# Patient Record
Sex: Male | Born: 1940 | Race: White | Hispanic: No | Smoking: Former smoker
Health system: Southern US, Community
[De-identification: ages and names within clinical notes are randomized; demographics above are authoritative.]

## PROBLEM LIST (undated history)

## (undated) DIAGNOSIS — I2510 Atherosclerotic heart disease of native coronary artery without angina pectoris: Secondary | ICD-10-CM

## (undated) DIAGNOSIS — M1990 Unspecified osteoarthritis, unspecified site: Secondary | ICD-10-CM

## (undated) DIAGNOSIS — R42 Dizziness and giddiness: Secondary | ICD-10-CM

## (undated) HISTORY — PX: HERNIA REPAIR: SHX51

## (undated) HISTORY — PX: OTHER SURGICAL HISTORY: SHX169

## (undated) HISTORY — PX: TONSILLECTOMY: SUR1361

## (undated) SURGERY — PACEMAKER IMPLANT
Anesthesia: IV Sedation (MBSC Only)

---

## 2001-11-16 ENCOUNTER — Ambulatory Visit (HOSPITAL_COMMUNITY): Admission: RE | Admit: 2001-11-16 | Discharge: 2001-11-16 | Payer: Self-pay | Admitting: Pulmonary Disease

## 2001-12-10 ENCOUNTER — Ambulatory Visit (HOSPITAL_COMMUNITY): Admission: RE | Admit: 2001-12-10 | Discharge: 2001-12-10 | Payer: Self-pay | Admitting: Specialist

## 2001-12-10 ENCOUNTER — Encounter: Payer: Self-pay | Admitting: Specialist

## 2002-04-11 ENCOUNTER — Encounter (HOSPITAL_COMMUNITY): Admission: RE | Admit: 2002-04-11 | Discharge: 2002-05-11 | Payer: Self-pay | Admitting: *Deleted

## 2002-04-13 ENCOUNTER — Ambulatory Visit (HOSPITAL_COMMUNITY): Admission: RE | Admit: 2002-04-13 | Discharge: 2002-04-13 | Payer: Self-pay | Admitting: Pulmonary Disease

## 2002-05-11 ENCOUNTER — Encounter (HOSPITAL_COMMUNITY): Admission: RE | Admit: 2002-05-11 | Discharge: 2002-06-10 | Payer: Self-pay | Admitting: *Deleted

## 2002-06-10 ENCOUNTER — Encounter (HOSPITAL_COMMUNITY): Admission: RE | Admit: 2002-06-10 | Discharge: 2002-07-10 | Payer: Self-pay | Admitting: *Deleted

## 2002-07-11 ENCOUNTER — Encounter (HOSPITAL_COMMUNITY): Admission: RE | Admit: 2002-07-11 | Discharge: 2002-08-10 | Payer: Self-pay | Admitting: *Deleted

## 2003-05-11 ENCOUNTER — Ambulatory Visit (HOSPITAL_COMMUNITY)
Admission: RE | Admit: 2003-05-11 | Discharge: 2003-05-11 | Payer: Self-pay | Admitting: Physical Medicine and Rehabilitation

## 2003-05-11 ENCOUNTER — Encounter: Payer: Self-pay | Admitting: Physical Medicine and Rehabilitation

## 2004-06-05 ENCOUNTER — Ambulatory Visit (HOSPITAL_COMMUNITY): Admission: RE | Admit: 2004-06-05 | Discharge: 2004-06-05 | Payer: Self-pay | Admitting: Pulmonary Disease

## 2004-10-31 ENCOUNTER — Inpatient Hospital Stay (HOSPITAL_COMMUNITY): Admission: EM | Admit: 2004-10-31 | Discharge: 2004-11-13 | Payer: Self-pay | Admitting: Emergency Medicine

## 2004-10-31 ENCOUNTER — Ambulatory Visit: Payer: Self-pay | Admitting: Pulmonary Disease

## 2004-11-04 ENCOUNTER — Encounter (INDEPENDENT_AMBULATORY_CARE_PROVIDER_SITE_OTHER): Payer: Self-pay | Admitting: Cardiology

## 2004-11-21 ENCOUNTER — Emergency Department (HOSPITAL_COMMUNITY): Admission: EM | Admit: 2004-11-21 | Discharge: 2004-11-21 | Payer: Self-pay | Admitting: Emergency Medicine

## 2004-12-25 ENCOUNTER — Ambulatory Visit: Payer: Self-pay | Admitting: Pulmonary Disease

## 2005-01-02 ENCOUNTER — Ambulatory Visit: Payer: Self-pay

## 2005-01-13 ENCOUNTER — Ambulatory Visit: Payer: Self-pay | Admitting: Pulmonary Disease

## 2006-10-17 ENCOUNTER — Emergency Department (HOSPITAL_COMMUNITY): Admission: EM | Admit: 2006-10-17 | Discharge: 2006-10-17 | Payer: Self-pay | Admitting: Emergency Medicine
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
## 2007-11-30 ENCOUNTER — Ambulatory Visit (HOSPITAL_COMMUNITY): Admission: RE | Admit: 2007-11-30 | Discharge: 2007-11-30 | Payer: Self-pay | Admitting: Pulmonary Disease
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
## 2014-05-17 NOTE — Patient Instructions (Signed)
Your procedure is scheduled on: 05/29/2014  Report to [facility] at  700   AM.  Call this number if you have problems the morning of surgery: [phone]   Do not eat food or drink liquids :After Midnight.      Take these medicines the morning of surgery with A SIP OF WATER: none   Do not wear jewelry, make-up or nail polish.  Do not wear lotions, powders, or perfumes.   Do not shave 48 hours prior to surgery.  Do not bring valuables to the hospital.  Contacts, dentures or bridgework may not be worn into surgery.  Leave suitcase in the car. After surgery it may be brought to your room.  For patients admitted to the hospital, checkout time is 11:00 AM the day of discharge.   Patients discharged the day of surgery will not be allowed to drive home.  :     Please read over the following fact sheets that you were given: Coughing and Deep Breathing, Surgical Site Infection Prevention, Anesthesia Post-op Instructions and Care and Recovery After Surgery    Cataract A cataract is a clouding of the lens of the eye. When a lens becomes cloudy, vision is reduced based on the degree and nature of the clouding. Many cataracts reduce vision to some degree. Some cataracts make people more near-sighted as they develop. Other cataracts increase glare. Cataracts that are ignored and become worse can sometimes look white. The white color can be seen through the pupil. CAUSES   Aging. However, cataracts may occur at any age, even in newborns.   Certain drugs.   Trauma to the eye.   Certain diseases such as diabetes.   Specific eye diseases such as chronic inflammation inside the eye or a sudden attack of a rare form of glaucoma.   Inherited or acquired medical problems.  SYMPTOMS   Gradual, progressive drop in vision in the affected eye.   Severe, rapid visual loss. This most often happens when trauma is the cause.  DIAGNOSIS  To detect a cataract, an eye doctor examines the lens. Cataracts are  best diagnosed with an exam of the eyes with the pupils enlarged (dilated) by drops.  TREATMENT  For an early cataract, vision may improve by using different eyeglasses or stronger lighting. If that does not help your vision, surgery is the only effective treatment. A cataract needs to be surgically removed when vision loss interferes with your everyday activities, such as driving, reading, or watching TV. A cataract may also have to be removed if it prevents examination or treatment of another eye problem. Surgery removes the cloudy lens and usually replaces it with a substitute lens (intraocular lens, IOL).  At a time when both you and your doctor agree, the cataract will be surgically removed. If you have cataracts in both eyes, only one is usually removed at a time. This allows the operated eye to heal and be out of danger from any possible problems after surgery (such as infection or poor wound healing). In rare cases, a cataract may be doing damage to your eye. In these cases, your caregiver may advise surgical removal right away. The vast majority of people who have cataract surgery have better vision afterward. HOME CARE INSTRUCTIONS  If you are not planning surgery, you may be asked to do the following:  Use different eyeglasses.   Use stronger or brighter lighting.   Ask your eye doctor about reducing your medicine dose or changing medicines  if it is thought that a medicine caused your cataract. Changing medicines does not make the cataract go away on its own.   Become familiar with your surroundings. Poor vision can lead to injury. Avoid bumping into things on the affected side. You are at a higher risk for tripping or falling.   Exercise extreme care when driving or operating machinery.   Wear sunglasses if you are sensitive to bright light or experiencing problems with glare.  SEEK IMMEDIATE MEDICAL CARE IF:   You have a worsening or sudden vision loss.   You notice redness,  swelling, or increasing pain in the eye.   You have a fever.  Document Released: 08/11/2005 Document Revised: 07/31/2011 Document Reviewed: 04/04/2011 [facility] Patient Information 2012 [place].PATIENT INSTRUCTIONS POST-ANESTHESIA  IMMEDIATELY FOLLOWING SURGERY:  Do not drive or operate machinery for the first twenty four hours after surgery.  Do not make any important decisions for twenty four hours after surgery or while taking narcotic pain medications or sedatives.  If you develop intractable nausea and vomiting or a severe headache please notify your doctor immediately.  FOLLOW-UP:  Please make an appointment with your surgeon as instructed. You do not need to follow up with anesthesia unless specifically instructed to do so.  WOUND CARE INSTRUCTIONS (if applicable):  Keep a dry clean dressing on the anesthesia/puncture wound site if there is drainage.  Once the wound has quit draining you may leave it open to air.  Generally you should leave the bandage intact for twenty four hours unless there is drainage.  If the epidural site drains for more than 36-48 hours please call the anesthesia department.  QUESTIONS?:  Please feel free to call your physician or the hospital operator if you have any questions, and they will be happy to assist you.

## 2014-05-18 ENCOUNTER — Encounter (HOSPITAL_COMMUNITY): Payer: Self-pay

## 2014-05-18 ENCOUNTER — Encounter (HOSPITAL_COMMUNITY)
Admission: RE | Admit: 2014-05-18 | Discharge: 2014-05-18 | Disposition: A | Discharge status: Home / Self Care | Payer: Medicare HMO | Source: Ambulatory Visit | Attending: Ophthalmology | Admitting: Ophthalmology

## 2014-05-18 ENCOUNTER — Encounter (HOSPITAL_COMMUNITY): Payer: Self-pay | Admitting: Pharmacy Technician

## 2014-05-18 DIAGNOSIS — Z01810 Encounter for preprocedural cardiovascular examination: Secondary | ICD-10-CM | POA: Insufficient documentation

## 2014-05-18 DIAGNOSIS — H2510 Age-related nuclear cataract, unspecified eye: Secondary | ICD-10-CM | POA: Insufficient documentation

## 2014-05-18 DIAGNOSIS — Z01812 Encounter for preprocedural laboratory examination: Secondary | ICD-10-CM | POA: Diagnosis present

## 2014-05-18 HISTORY — DX: Unspecified osteoarthritis, unspecified site: M19.90

## 2014-05-18 LAB — BASIC METABOLIC PANEL
Anion gap: 10 (ref 5–15)
BUN: 15 mg/dL (ref 6–23)
CALCIUM: 9.4 mg/dL (ref 8.4–10.5)
CO2: 28 meq/L (ref 19–32)
Chloride: 106 mEq/L (ref 96–112)
Creatinine, Ser: 0.85 mg/dL (ref 0.50–1.35)
GFR calc Af Amer: >90 mL/min (ref >90)
GFR calc non Af Amer: 86 mL/min — ABNORMAL LOW (ref >90)
GLUCOSE: 81 mg/dL (ref 70–99)
Potassium: 4.6 mEq/L (ref 3.7–5.3)
Sodium: 144 mEq/L (ref 137–147)

## 2014-05-18 LAB — HEMOGLOBIN AND HEMATOCRIT, BLOOD
HCT: 40.1 % (ref 39.0–52.0)
Hemoglobin: 13.4 g/dL (ref 13.0–17.0)

## 2014-05-18 NOTE — Pre-Procedure Instructions (Signed)
Pt. Given info for MyChart. To be set up at home. 

## 2014-05-26 MED ORDER — TETRACAINE HCL 0.5 % OP SOLN
OPHTHALMIC | Status: AC
  Filled 2014-05-26: qty 2

## 2014-05-26 MED ORDER — LIDOCAINE HCL 3.5 % OP GEL
OPHTHALMIC | Status: AC
  Filled 2014-05-26: qty 1

## 2014-05-26 MED ORDER — CYCLOPENTOLATE-PHENYLEPHRINE OP SOLN OPTIME - NO CHARGE
OPHTHALMIC | Status: AC
  Filled 2014-05-26: qty 2

## 2014-05-29 ENCOUNTER — Encounter (HOSPITAL_COMMUNITY): Payer: Self-pay | Admitting: *Deleted

## 2014-05-29 ENCOUNTER — Encounter (HOSPITAL_COMMUNITY)
Admission: RE | Disposition: A | Discharge status: Home / Self Care | Payer: Self-pay | Source: Ambulatory Visit | Attending: Ophthalmology

## 2014-05-29 ENCOUNTER — Ambulatory Visit (HOSPITAL_COMMUNITY): Payer: Medicare HMO | Admitting: Anesthesiology

## 2014-05-29 ENCOUNTER — Ambulatory Visit (HOSPITAL_COMMUNITY)
Admission: RE | Admit: 2014-05-29 | Discharge: 2014-05-29 | Disposition: A | Discharge status: Home / Self Care | Payer: Medicare HMO | Source: Ambulatory Visit | Attending: Ophthalmology | Admitting: Ophthalmology

## 2014-05-29 ENCOUNTER — Encounter (HOSPITAL_COMMUNITY): Payer: Medicare HMO | Admitting: Anesthesiology

## 2014-05-29 DIAGNOSIS — Z87891 Personal history of nicotine dependence: Secondary | ICD-10-CM | POA: Insufficient documentation

## 2014-05-29 DIAGNOSIS — H269 Unspecified cataract: Secondary | ICD-10-CM | POA: Diagnosis not present

## 2014-05-29 DIAGNOSIS — Z791 Long term (current) use of non-steroidal anti-inflammatories (NSAID): Secondary | ICD-10-CM | POA: Insufficient documentation

## 2014-05-29 HISTORY — PX: CATARACT EXTRACTION W/PHACO: SHX586

## 2014-05-29 SURGERY — PHACOEMULSIFICATION, CATARACT, WITH IOL INSERTION
Anesthesia: Monitor Anesthesia Care | Laterality: Left

## 2014-05-29 MED ORDER — TETRACAINE HCL 0.5 % OP SOLN
1.0000 [drp] | OPHTHALMIC | Status: AC
  Administered 2014-05-29 (×3): 1 [drp] via OPHTHALMIC

## 2014-05-29 MED ORDER — MIDAZOLAM HCL 2 MG/2ML IJ SOLN
1.0000 mg | INTRAMUSCULAR | Status: DC | PRN
  Administered 2014-05-29: 2 mg via INTRAVENOUS

## 2014-05-29 MED ORDER — MIDAZOLAM HCL 2 MG/2ML IJ SOLN
INTRAMUSCULAR | Status: AC
  Filled 2014-05-29: qty 2

## 2014-05-29 MED ORDER — LACTATED RINGERS IV SOLN
INTRAVENOUS | Status: DC
  Administered 2014-05-29: 1000 mL via INTRAVENOUS
  Administered 2014-05-29: 08:00:00 via INTRAVENOUS

## 2014-05-29 MED ORDER — EPINEPHRINE HCL 1 MG/ML IJ SOLN
INTRAMUSCULAR | Status: AC
  Filled 2014-05-29: qty 1

## 2014-05-29 MED ORDER — NA HYALUR & NA CHOND-NA HYALUR 0.55-0.5 ML IO KIT
PACK | INTRAOCULAR | Status: DC | PRN
  Administered 2014-05-29: 1 via OPHTHALMIC

## 2014-05-29 MED ORDER — EPINEPHRINE HCL 1 MG/ML IJ SOLN
INTRAMUSCULAR | Status: DC | PRN
  Administered 2014-05-29: 09:00:00

## 2014-05-29 MED ORDER — CYCLOPENTOLATE-PHENYLEPHRINE 0.2-1 % OP SOLN
1.0000 [drp] | OPHTHALMIC | Status: AC
  Administered 2014-05-29 (×3): 1 [drp] via OPHTHALMIC

## 2014-05-29 MED ORDER — POVIDONE-IODINE 5 % OP SOLN
OPHTHALMIC | Status: DC | PRN
  Administered 2014-05-29: 1 via OPHTHALMIC

## 2014-05-29 MED ORDER — FENTANYL CITRATE 0.05 MG/ML IJ SOLN
INTRAMUSCULAR | Status: AC
  Filled 2014-05-29: qty 2

## 2014-05-29 MED ORDER — TETRACAINE 0.5 % OP SOLN OPTIME - NO CHARGE
OPHTHALMIC | Status: DC | PRN
  Administered 2014-05-29: 2 [drp] via OPHTHALMIC

## 2014-05-29 MED ORDER — FENTANYL CITRATE 0.05 MG/ML IJ SOLN
25.0000 ug | INTRAMUSCULAR | Status: AC
  Administered 2014-05-29 (×2): 25 ug via INTRAVENOUS

## 2014-05-29 MED ORDER — LIDOCAINE HCL 3.5 % OP GEL
OPHTHALMIC | Status: DC | PRN
  Administered 2014-05-29: 1 via OPHTHALMIC

## 2014-05-29 MED ORDER — BSS IO SOLN
INTRAOCULAR | Status: DC | PRN
  Administered 2014-05-29: 15 mL

## 2014-05-29 SURGICAL SUPPLY — 9 items
CLOTH BEACON ORANGE TIMEOUT ST (SAFETY) ×2 IMPLANT
GLOVE BIOGEL PI IND STRL 6.5 (GLOVE) IMPLANT
GLOVE BIOGEL PI IND STRL 7.0 (GLOVE) IMPLANT
GLOVE BIOGEL PI INDICATOR 6.5 (GLOVE) ×2
GLOVE BIOGEL PI INDICATOR 7.0 (GLOVE) ×2
INST SET CATARACT ~~LOC~~ (KITS) ×3 IMPLANT
PAD ARMBOARD 7.5X6 YLW CONV (MISCELLANEOUS) ×2 IMPLANT
SIGHTPATH CAT PROC W REG LENS (Ophthalmic Related) ×3 IMPLANT
WATER STERILE IRR 250ML POUR (IV SOLUTION) ×2 IMPLANT

## 2014-05-29 NOTE — Brief Op Note (Signed)
05/29/2014  10:29 AM  PATIENT:  Lawrence Bradley  73 y.o. male  PRE-OPERATIVE DIAGNOSIS:  Cataract left eye; difficulty performing daily activities  POST-OPERATIVE DIAGNOSIS:  Cataract left eye; difficulty performing daily activities  PROCEDURE:  Procedure(s): CATARACT EXTRACTION PHACO AND INTRAOCULAR LENS PLACEMENT LEFT EYE CDE=9.33  SURGEON:  Surgeon(s): Williams Che, MD  ASSISTANTS: Cleda Clarks, CST   ANESTHESIA STAFF: Anesthesiologist: Lerry Liner, MD CRNA: Genevie Ann, CRNA  ANESTHESIA:   topical and MAC  REQUESTED LENS POWER: 24.0  LENS IMPLANT INFORMATION:  [employer] SN60WF +24.0 s/n [id number]  Exp 07/2018  CUMULATIVE DISSIPATED ENERGY:9.33  INDICATIONS:see office H&P  OP FINDINGS:dense NS  COMPLICATIONS:None  DICTATION #: see scanned op note  PLAN OF CARE: as above  PATIENT DISPOSITION:  Short Stay

## 2014-05-29 NOTE — Anesthesia Procedure Notes (Signed)
Procedure Name: MAC Date/Time: 05/29/2014 8:20 AM Performed by: Isadora Delorey, Sheron Nightingale

## 2014-05-29 NOTE — Anesthesia Preprocedure Evaluation (Signed)
Anesthesia Evaluation  Patient identified by MRN, date of birth, ID band Patient awake    Reviewed: Allergy & Precautions, H&P , NPO status , Patient's Chart, lab work & pertinent test results  Airway Mallampati: II  TM Distance: >3 FB     Dental  (+) Upper Dentures, Lower Dentures   Pulmonary former smoker,  breath sounds clear to auscultation        Cardiovascular negative cardio ROS  Rhythm:Regular Rate:Normal     Neuro/Psych    GI/Hepatic negative GI ROS,   Endo/Other    Renal/GU      Musculoskeletal   Abdominal   Peds  Hematology   Anesthesia Other Findings   Reproductive/Obstetrics                             Anesthesia Physical Anesthesia Plan  ASA: II  Anesthesia Plan: MAC   Post-op Pain Management:    Induction: Intravenous  Airway Management Planned: Nasal Cannula  Additional Equipment:   Intra-op Plan:   Post-operative Plan:   Informed Consent: I have reviewed the patients History and Physical, chart, labs and discussed the procedure including the risks, benefits and alternatives for the proposed anesthesia with the patient or authorized representative who has indicated his/her understanding and acceptance.     Plan Discussed with:   Anesthesia Plan Comments:         Anesthesia Quick Evaluation  

## 2014-05-29 NOTE — Discharge Instructions (Signed)
Lawrence Bradley 05/29/2014 Dr. Iona Hansen Post operative Instructions for Cataract Patients  These instructions are for [facility] and pertain to the operative eye.  1.  Resume your normal diet and previous oral medicines.  2. Your Follow-up appointment is at Dr. Iona Hansen' office in [place] on 05/30/14 at 11:15am  3. You may leave the hospital when your driver is present and your nurse releases you.  4. Begin Pred Forte (prednisolone acetate 1%), Acular LS (ketorolac tromethamine .4%) and Gatifloxacin 0.5% eye drops; 1 drop each 4 times daily to operative eye. Begin 3 hours after discharge from Short Stay Unit.  Moxifloxacin 0.5% may be substituted for Gatifloxacin using the same instructions.  77. Page Dr. Iona Hansen via beeper [phone] for significant pain in or around operative eye that is not relieved by Tylenol.  6. If you took Plavix before surgery, restart it at the usual dose on the evening of surgery.  7. Wear dark glasses as necessary for excessive light sensitivity.  8. Do no forcefully rub you your operative eye.  9. Keep your operative eye dry for 1 week. You may gently clean your eyelids with a damp washcloth.  10. You may resume normal occupational activities in one week and resume driving as tolerated after the first post operative visit.  11. It is normal to have blurred vision and a scratchy sensation following surgery.  Dr. Iona Hansen: [phone]

## 2014-05-29 NOTE — Transfer of Care (Signed)
Immediate Anesthesia Transfer of Care Note  Patient: Lawrence Bradley  Procedure(s) Performed: Procedure(s): CATARACT EXTRACTION PHACO AND INTRAOCULAR LENS PLACEMENT LEFT EYE CDE=9.33 (Left)  Patient Location: PACU  Anesthesia Type:MAC  Level of Consciousness: awake, alert  and oriented  Airway & Oxygen Therapy: Patient Spontanous Breathing  Post-op Assessment: Report given to PACU RN and Post -op Vital signs reviewed and stable  Post vital signs: Reviewed and stable  Complications: No apparent anesthesia complications

## 2014-05-29 NOTE — Op Note (Signed)
See scanned op note 

## 2014-05-29 NOTE — H&P (Signed)
I have reviewed the pre printed H&P, the patient was re-examined, and I have identified no significant interval changes in the patient's medical condition.  There is no change in the plan of care since the history and physical of record. 

## 2014-05-30 NOTE — Anesthesia Postprocedure Evaluation (Signed)
  Anesthesia Post-op Note  Patient: Lawrence Bradley  Procedure(s) Performed: Procedure(s): CATARACT EXTRACTION PHACO AND INTRAOCULAR LENS PLACEMENT LEFT EYE CDE=9.33 (Left)  Patient Location: Short Stay  Anesthesia Type:MAC  Level of Consciousness: awake, alert  and oriented  Airway and Oxygen Therapy: Patient Spontanous Breathing  Post-op Pain: none  Post-op Assessment: Post-op Vital signs reviewed, Patient's Cardiovascular Status Stable, Respiratory Function Stable, Patent Airway and No signs of Nausea or vomiting  Post-op Vital Signs: Reviewed  Last Vitals:  Filed Vitals:   05/29/14 0905  BP: 144/70  Pulse: 54  Temp: 36.6 C  Resp: 20    Complications: No apparent anesthesia complications, Addendum to complete chart and charges so encounter could be closed.

## 2014-06-01 ENCOUNTER — Encounter (HOSPITAL_COMMUNITY): Payer: Self-pay | Admitting: Ophthalmology

## 2014-08-07 MED ORDER — ONDANSETRON HCL 4 MG/2ML IJ SOLN: 4.0000 mg | Freq: Once | INTRAMUSCULAR | Status: AC | PRN

## 2014-08-07 MED ORDER — FENTANYL CITRATE 0.05 MG/ML IJ SOLN: 25.0000 ug | INTRAMUSCULAR | Status: DC | PRN

## 2014-08-07 NOTE — Patient Instructions (Signed)
Your procedure is scheduled on: 08/14/2014  Report to [facility] at  800  AM.  Call this number if you have problems the morning of surgery: [phone]   Do not eat food or drink liquids :After Midnight.      Take these medicines the morning of surgery with A SIP OF WATER: none   Do not wear jewelry, make-up or nail polish.  Do not wear lotions, powders, or perfumes.   Do not shave 48 hours prior to surgery.  Do not bring valuables to the hospital.  Contacts, dentures or bridgework may not be worn into surgery.  Leave suitcase in the car. After surgery it may be brought to your room.  For patients admitted to the hospital, checkout time is 11:00 AM the day of discharge.   Patients discharged the day of surgery will not be allowed to drive home.  :     Please read over the following fact sheets that you were given: Coughing and Deep Breathing, Surgical Site Infection Prevention, Anesthesia Post-op Instructions and Care and Recovery After Surgery    Cataract A cataract is a clouding of the lens of the eye. When a lens becomes cloudy, vision is reduced based on the degree and nature of the clouding. Many cataracts reduce vision to some degree. Some cataracts make people more near-sighted as they develop. Other cataracts increase glare. Cataracts that are ignored and become worse can sometimes look white. The white color can be seen through the pupil. CAUSES   Aging. However, cataracts may occur at any age, even in newborns.   Certain drugs.   Trauma to the eye.   Certain diseases such as diabetes.   Specific eye diseases such as chronic inflammation inside the eye or a sudden attack of a rare form of glaucoma.   Inherited or acquired medical problems.  SYMPTOMS   Gradual, progressive drop in vision in the affected eye.   Severe, rapid visual loss. This most often happens when trauma is the cause.  DIAGNOSIS  To detect a cataract, an eye doctor examines the lens. Cataracts are  best diagnosed with an exam of the eyes with the pupils enlarged (dilated) by drops.  TREATMENT  For an early cataract, vision may improve by using different eyeglasses or stronger lighting. If that does not help your vision, surgery is the only effective treatment. A cataract needs to be surgically removed when vision loss interferes with your everyday activities, such as driving, reading, or watching TV. A cataract may also have to be removed if it prevents examination or treatment of another eye problem. Surgery removes the cloudy lens and usually replaces it with a substitute lens (intraocular lens, IOL).  At a time when both you and your doctor agree, the cataract will be surgically removed. If you have cataracts in both eyes, only one is usually removed at a time. This allows the operated eye to heal and be out of danger from any possible problems after surgery (such as infection or poor wound healing). In rare cases, a cataract may be doing damage to your eye. In these cases, your caregiver may advise surgical removal right away. The vast majority of people who have cataract surgery have better vision afterward. HOME CARE INSTRUCTIONS  If you are not planning surgery, you may be asked to do the following:  Use different eyeglasses.   Use stronger or brighter lighting.   Ask your eye doctor about reducing your medicine dose or changing medicines if  it is thought that a medicine caused your cataract. Changing medicines does not make the cataract go away on its own.   Become familiar with your surroundings. Poor vision can lead to injury. Avoid bumping into things on the affected side. You are at a higher risk for tripping or falling.   Exercise extreme care when driving or operating machinery.   Wear sunglasses if you are sensitive to bright light or experiencing problems with glare.  SEEK IMMEDIATE MEDICAL CARE IF:   You have a worsening or sudden vision loss.   You notice redness,  swelling, or increasing pain in the eye.   You have a fever.  Document Released: 08/11/2005 Document Revised: 07/31/2011 Document Reviewed: 04/04/2011 [facility] Patient Information 2012 [place].PATIENT INSTRUCTIONS POST-ANESTHESIA  IMMEDIATELY FOLLOWING SURGERY:  Do not drive or operate machinery for the first twenty four hours after surgery.  Do not make any important decisions for twenty four hours after surgery or while taking narcotic pain medications or sedatives.  If you develop intractable nausea and vomiting or a severe headache please notify your doctor immediately.  FOLLOW-UP:  Please make an appointment with your surgeon as instructed. You do not need to follow up with anesthesia unless specifically instructed to do so.  WOUND CARE INSTRUCTIONS (if applicable):  Keep a dry clean dressing on the anesthesia/puncture wound site if there is drainage.  Once the wound has quit draining you may leave it open to air.  Generally you should leave the bandage intact for twenty four hours unless there is drainage.  If the epidural site drains for more than 36-48 hours please call the anesthesia department.  QUESTIONS?:  Please feel free to call your physician or the hospital operator if you have any questions, and they will be happy to assist you.

## 2014-08-08 ENCOUNTER — Encounter (HOSPITAL_COMMUNITY)
Admission: RE | Admit: 2014-08-08 | Discharge: 2014-08-08 | Disposition: A | Discharge status: Home / Self Care | Payer: Commercial Managed Care - HMO | Source: Ambulatory Visit | Attending: Ophthalmology | Admitting: Ophthalmology

## 2014-08-08 DIAGNOSIS — Z01812 Encounter for preprocedural laboratory examination: Secondary | ICD-10-CM | POA: Diagnosis not present

## 2014-08-08 LAB — BASIC METABOLIC PANEL
Anion gap: 12 (ref 5–15)
BUN: 18 mg/dL (ref 6–23)
CALCIUM: 9.9 mg/dL (ref 8.4–10.5)
CO2: 28 meq/L (ref 19–32)
CREATININE: 0.94 mg/dL (ref 0.50–1.35)
Chloride: 104 mEq/L (ref 96–112)
GFR calc Af Amer: >90 mL/min (ref >90)
GFR calc non Af Amer: 82 mL/min — ABNORMAL LOW (ref >90)
GLUCOSE: 83 mg/dL (ref 70–99)
Potassium: 4.6 mEq/L (ref 3.7–5.3)
Sodium: 144 mEq/L (ref 137–147)

## 2014-08-08 LAB — HEMOGLOBIN AND HEMATOCRIT, BLOOD
HCT: 39.9 % (ref 39.0–52.0)
HEMOGLOBIN: 13.3 g/dL (ref 13.0–17.0)

## 2014-08-14 ENCOUNTER — Ambulatory Visit (HOSPITAL_COMMUNITY): Payer: Commercial Managed Care - HMO | Admitting: Anesthesiology

## 2014-08-14 ENCOUNTER — Encounter (HOSPITAL_COMMUNITY): Payer: Self-pay | Admitting: *Deleted

## 2014-08-14 ENCOUNTER — Encounter (HOSPITAL_COMMUNITY)
Admission: RE | Disposition: A | Discharge status: Home / Self Care | Payer: Self-pay | Source: Ambulatory Visit | Attending: Ophthalmology

## 2014-08-14 ENCOUNTER — Ambulatory Visit (HOSPITAL_COMMUNITY)
Admission: RE | Admit: 2014-08-14 | Discharge: 2014-08-14 | Disposition: A | Discharge status: Home / Self Care | Payer: Commercial Managed Care - HMO | Source: Ambulatory Visit | Attending: Ophthalmology | Admitting: Ophthalmology

## 2014-08-14 DIAGNOSIS — H2511 Age-related nuclear cataract, right eye: Secondary | ICD-10-CM | POA: Insufficient documentation

## 2014-08-14 HISTORY — DX: Dizziness and giddiness: R42

## 2014-08-14 HISTORY — PX: CATARACT EXTRACTION W/PHACO: SHX586

## 2014-08-14 SURGERY — PHACOEMULSIFICATION, CATARACT, WITH IOL INSERTION
Anesthesia: Monitor Anesthesia Care | Site: Eye | Laterality: Right

## 2014-08-14 MED ORDER — EPINEPHRINE HCL 1 MG/ML IJ SOLN
INTRAOCULAR | Status: DC | PRN
  Administered 2014-08-14: 500 mL

## 2014-08-14 MED ORDER — TETRACAINE HCL 0.5 % OP SOLN
OPHTHALMIC | Status: AC
  Filled 2014-08-14: qty 2

## 2014-08-14 MED ORDER — LACTATED RINGERS IV SOLN
INTRAVENOUS | Status: DC
  Administered 2014-08-14: 1000 mL via INTRAVENOUS

## 2014-08-14 MED ORDER — CYCLOPENTOLATE-PHENYLEPHRINE OP SOLN OPTIME - NO CHARGE
OPHTHALMIC | Status: AC
  Filled 2014-08-14: qty 2

## 2014-08-14 MED ORDER — FENTANYL CITRATE 0.05 MG/ML IJ SOLN
25.0000 ug | INTRAMUSCULAR | Status: AC
  Administered 2014-08-14 (×2): 25 ug via INTRAVENOUS
  Filled 2014-08-14: qty 2

## 2014-08-14 MED ORDER — LIDOCAINE HCL 3.5 % OP GEL
OPHTHALMIC | Status: DC | PRN
  Administered 2014-08-14: 1 via OPHTHALMIC

## 2014-08-14 MED ORDER — LIDOCAINE HCL 3.5 % OP GEL
OPHTHALMIC | Status: AC
  Filled 2014-08-14: qty 1

## 2014-08-14 MED ORDER — BSS IO SOLN
INTRAOCULAR | Status: DC | PRN
  Administered 2014-08-14: 15 mL via INTRAOCULAR

## 2014-08-14 MED ORDER — EPINEPHRINE HCL 1 MG/ML IJ SOLN
INTRAMUSCULAR | Status: AC
  Filled 2014-08-14: qty 1

## 2014-08-14 MED ORDER — LIDOCAINE HCL 3.5 % OP GEL: 1.0000 "application " | Freq: Once | OPHTHALMIC | Status: DC

## 2014-08-14 MED ORDER — NA HYALUR & NA CHOND-NA HYALUR 0.55-0.5 ML IO KIT
PACK | INTRAOCULAR | Status: DC | PRN
  Administered 2014-08-14: 1 via OPHTHALMIC

## 2014-08-14 MED ORDER — CYCLOPENTOLATE-PHENYLEPHRINE 0.2-1 % OP SOLN
1.0000 [drp] | OPHTHALMIC | Status: AC
  Administered 2014-08-14 (×3): 1 [drp] via OPHTHALMIC

## 2014-08-14 MED ORDER — TETRACAINE HCL 0.5 % OP SOLN
1.0000 [drp] | OPHTHALMIC | Status: AC
  Administered 2014-08-14 (×3): 1 [drp] via OPHTHALMIC

## 2014-08-14 MED ORDER — POVIDONE-IODINE 5 % OP SOLN
OPHTHALMIC | Status: DC | PRN
  Administered 2014-08-14: 1 via OPHTHALMIC

## 2014-08-14 MED ORDER — TETRACAINE 0.5 % OP SOLN OPTIME - NO CHARGE
OPHTHALMIC | Status: DC | PRN
  Administered 2014-08-14: 1 [drp] via OPHTHALMIC

## 2014-08-14 MED ORDER — MIDAZOLAM HCL 2 MG/2ML IJ SOLN
1.0000 mg | INTRAMUSCULAR | Status: AC | PRN
  Administered 2014-08-14: 2 mg via INTRAVENOUS
  Administered 2014-08-14 (×2): 1 mg via INTRAVENOUS
  Filled 2014-08-14 (×2): qty 2

## 2014-08-14 SURGICAL SUPPLY — 28 items
CAPSULAR TENSION RING-AMO (OPHTHALMIC RELATED) IMPLANT
CLOTH BEACON ORANGE TIMEOUT ST (SAFETY) ×2 IMPLANT
GLOVE BIO SURGEON STRL SZ7.5 (GLOVE) IMPLANT
GLOVE BIOGEL M 6.5 STRL (GLOVE) IMPLANT
GLOVE BIOGEL PI IND STRL 6.5 (GLOVE) IMPLANT
GLOVE BIOGEL PI IND STRL 7.0 (GLOVE) IMPLANT
GLOVE BIOGEL PI INDICATOR 6.5 (GLOVE) ×2
GLOVE BIOGEL PI INDICATOR 7.0 (GLOVE)
GLOVE ECLIPSE 6.5 STRL STRAW (GLOVE) IMPLANT
GLOVE ECLIPSE 7.5 STRL STRAW (GLOVE) IMPLANT
GLOVE EXAM NITRILE LRG STRL (GLOVE) IMPLANT
GLOVE EXAM NITRILE MD LF STRL (GLOVE) ×2 IMPLANT
GLOVE SKINSENSE NS SZ6.5 (GLOVE)
GLOVE SKINSENSE NS SZ7.0 (GLOVE)
GLOVE SKINSENSE STRL SZ6.5 (GLOVE) IMPLANT
GLOVE SKINSENSE STRL SZ7.0 (GLOVE) IMPLANT
INST SET CATARACT ~~LOC~~ (KITS) ×3 IMPLANT
KIT VITRECTOMY (OPHTHALMIC RELATED) IMPLANT
PAD ARMBOARD 7.5X6 YLW CONV (MISCELLANEOUS) ×2 IMPLANT
PROC W NO LENS (INTRAOCULAR LENS)
PROC W SPEC LENS (INTRAOCULAR LENS)
PROCESS W NO LENS (INTRAOCULAR LENS) IMPLANT
PROCESS W SPEC LENS (INTRAOCULAR LENS) IMPLANT
RETRACTOR IRIS SIGHTPATH (OPHTHALMIC RELATED) IMPLANT
RING MALYGIN (MISCELLANEOUS) IMPLANT
SIGHTPATH CAT PROC W REG LENS (Ophthalmic Related) ×3 IMPLANT
VISCOELASTIC ADDITIONAL (OPHTHALMIC RELATED) IMPLANT
WATER STERILE IRR 250ML POUR (IV SOLUTION) ×2 IMPLANT

## 2014-08-14 NOTE — Anesthesia Preprocedure Evaluation (Signed)
Anesthesia Evaluation  Patient identified by MRN, date of birth, ID band Patient awake    Reviewed: Allergy & Precautions, H&P , NPO status , Patient's Chart, lab work & pertinent test results  Airway Mallampati: II  TM Distance: >3 FB     Dental  (+) Upper Dentures, Lower Dentures   Pulmonary former smoker,  breath sounds clear to auscultation        Cardiovascular negative cardio ROS  Rhythm:Regular Rate:Normal     Neuro/Psych    GI/Hepatic negative GI ROS,   Endo/Other    Renal/GU      Musculoskeletal   Abdominal   Peds  Hematology   Anesthesia Other Findings   Reproductive/Obstetrics                             Anesthesia Physical Anesthesia Plan  ASA: II  Anesthesia Plan: MAC   Post-op Pain Management:    Induction: Intravenous  Airway Management Planned: Nasal Cannula  Additional Equipment:   Intra-op Plan:   Post-operative Plan:   Informed Consent: I have reviewed the patients History and Physical, chart, labs and discussed the procedure including the risks, benefits and alternatives for the proposed anesthesia with the patient or authorized representative who has indicated his/her understanding and acceptance.     Plan Discussed with:   Anesthesia Plan Comments:         Anesthesia Quick Evaluation

## 2014-08-14 NOTE — Anesthesia Postprocedure Evaluation (Signed)
  Anesthesia Post-op Note  Patient: Lawrence Bradley  Procedure(s) Performed: Procedure(s): CATARACT EXTRACTION PHACO AND INTRAOCULAR LENS PLACEMENT; CDE: 9.98 (Right)  Patient Location: Short Stay  Anesthesia Type:MAC  Level of Consciousness: awake, alert , oriented and patient cooperative  Airway and Oxygen Therapy: Patient Spontanous Breathing  Post-op Pain: none  Post-op Assessment: Post-op Vital signs reviewed, Patient's Cardiovascular Status Stable, Respiratory Function Stable, Patent Airway and Pain level not controlled  Post-op Vital Signs: Reviewed and stable  Last Vitals:  Filed Vitals:   08/14/14 0940  BP: 133/78  Temp:   Resp: 24    Complications: No apparent anesthesia complications

## 2014-08-14 NOTE — Op Note (Signed)
See scanned op note done today

## 2014-08-14 NOTE — H&P (Signed)
I have reviewed the pre printed H&P, the patient was re-examined, and I have identified no significant interval changes in the patient's medical condition.  There is no change in the plan of care since the history and physical of record. 

## 2014-08-14 NOTE — Discharge Instructions (Signed)
JAHVIER ALDEA 08/14/2014 Dr. Iona Hansen Post operative Instructions for Cataract Patients  These instructions are for Lawrence Bradley and pertain to the operative eye.  1.  Resume your normal diet and previous oral medicines.  2. Your Follow-up appointment is at Dr. Iona Hansen' office in [place] on 08/15/14 at 10:15.  3. You may leave the hospital when your driver is present and your nurse releases you.  4. Begin Pred Forte (prednisolone acetate 1%), Acular LS (ketorolac tromethamine .4%) and Gatifloxacin 0.5% eye drops; 1 drop each 4 times daily to operative eye. Begin 3 hours after discharge from Short Stay Unit.  Moxifloxacin 0.5% may be substituted for Gatifloxacin using the same instructions.  64. Page Dr. Iona Hansen via beeper [phone] for significant pain in or around operative eye that is not relieved by Tylenol.  6. If you took Plavix before surgery, restart it at the usual dose on the evening of surgery.  7. Wear dark glasses as necessary for excessive light sensitivity.  8. Do no forcefully rub you your operative eye.  9. Keep your operative eye dry for 1 week. You may gently clean your eyelids with a damp washcloth.  10. You may resume normal occupational activities in one week and resume driving as tolerated after the first post operative visit.  11. It is normal to have blurred vision and a scratchy sensation following surgery.  Dr. Iona Hansen: [phone]

## 2014-08-14 NOTE — Transfer of Care (Signed)
Immediate Anesthesia Transfer of Care Note  Patient: Lawrence Bradley  Procedure(s) Performed: Procedure(s): CATARACT EXTRACTION PHACO AND INTRAOCULAR LENS PLACEMENT; CDE: 9.98 (Right)  Patient Location: Short Stay  Anesthesia Type:MAC  Level of Consciousness: awake, alert , oriented and patient cooperative  Airway & Oxygen Therapy: Patient Spontanous Breathing  Post-op Assessment: Report given to PACU RN, Post -op Vital signs reviewed and stable and Patient moving all extremities  Post vital signs: Reviewed and stable  Complications: No apparent anesthesia complications

## 2014-08-14 NOTE — Brief Op Note (Signed)
08/14/2014  10:32 AM  PATIENT:  Martie Round  73 y.o. male  PRE-OPERATIVE DIAGNOSIS:  nuclear cataract right eye  POST-OPERATIVE DIAGNOSIS:  nuclear cataract right eye  PROCEDURE:  Procedure(s): CATARACT EXTRACTION PHACO AND INTRAOCULAR LENS PLACEMENT; CDE: 9.98  SURGEON:  Surgeon(s): Williams Che, MD  ASSISTANTS: Bonney Roussel, CST;  Magdalene Patricia, CST   ANESTHESIA STAFF: Anesthesiologist: Lerry Liner, MD CRNA: Mickel Baas, CRNA; Charmaine Downs, CRNA  ANESTHESIA:   topical and MAC  REQUESTED LENS POWER: 22.0  LENS IMPLANT INFORMATION:  [employer] [id number]  S/n [id number]  Exp 09/2018  CUMULATIVE DISSIPATED ENERGY:9.98  INDICATIONS:see office H&P  OP FINDINGS:mod dense NS/PSC  COMPLICATIONS:None  DICTATION #: none  PLAN OF CARE: as above  PATIENT DISPOSITION:  Short Stay

## 2014-08-14 NOTE — Anesthesia Procedure Notes (Signed)
Procedure Name: MAC Date/Time: 08/14/2014 9:47 AM Performed by: Andree Elk, AMY A Pre-anesthesia Checklist: Patient identified, Timeout performed, Emergency Drugs available, Suction available and Patient being monitored Oxygen Delivery Method: Nasal cannula

## 2014-08-15 ENCOUNTER — Encounter (HOSPITAL_COMMUNITY): Payer: Self-pay | Admitting: Ophthalmology

## 2014-09-26 DIAGNOSIS — M1990 Unspecified osteoarthritis, unspecified site: Secondary | ICD-10-CM | POA: Diagnosis not present

## 2014-09-26 DIAGNOSIS — E782 Mixed hyperlipidemia: Secondary | ICD-10-CM | POA: Diagnosis not present

## 2014-10-03 DIAGNOSIS — F5221 Male erectile disorder: Secondary | ICD-10-CM | POA: Diagnosis not present

## 2014-10-03 DIAGNOSIS — E782 Mixed hyperlipidemia: Secondary | ICD-10-CM | POA: Diagnosis not present

## 2014-10-03 DIAGNOSIS — H269 Unspecified cataract: Secondary | ICD-10-CM | POA: Diagnosis not present

## 2014-10-03 DIAGNOSIS — M1990 Unspecified osteoarthritis, unspecified site: Secondary | ICD-10-CM | POA: Diagnosis not present

## 2014-11-22 DIAGNOSIS — Z961 Presence of intraocular lens: Secondary | ICD-10-CM | POA: Diagnosis not present

## 2015-05-01 DIAGNOSIS — Z0000 Encounter for general adult medical examination without abnormal findings: Secondary | ICD-10-CM | POA: Diagnosis not present

## 2015-05-09 DIAGNOSIS — Z961 Presence of intraocular lens: Secondary | ICD-10-CM | POA: Diagnosis not present

## 2015-05-09 DIAGNOSIS — H04123 Dry eye syndrome of bilateral lacrimal glands: Secondary | ICD-10-CM | POA: Diagnosis not present

## 2015-05-09 DIAGNOSIS — H538 Other visual disturbances: Secondary | ICD-10-CM | POA: Diagnosis not present

## 2015-05-17 DIAGNOSIS — L814 Other melanin hyperpigmentation: Secondary | ICD-10-CM | POA: Diagnosis not present

## 2015-05-17 DIAGNOSIS — L570 Actinic keratosis: Secondary | ICD-10-CM | POA: Diagnosis not present

## 2015-05-17 DIAGNOSIS — X32XXXA Exposure to sunlight, initial encounter: Secondary | ICD-10-CM | POA: Diagnosis not present

## 2015-05-17 DIAGNOSIS — D225 Melanocytic nevi of trunk: Secondary | ICD-10-CM | POA: Diagnosis not present

## 2015-05-17 DIAGNOSIS — L821 Other seborrheic keratosis: Secondary | ICD-10-CM | POA: Diagnosis not present

## 2015-05-17 DIAGNOSIS — L818 Other specified disorders of pigmentation: Secondary | ICD-10-CM | POA: Diagnosis not present

## 2016-05-06 DIAGNOSIS — M1990 Unspecified osteoarthritis, unspecified site: Secondary | ICD-10-CM | POA: Diagnosis not present

## 2016-05-06 DIAGNOSIS — E782 Mixed hyperlipidemia: Secondary | ICD-10-CM | POA: Diagnosis not present

## 2016-05-06 DIAGNOSIS — F5221 Male erectile disorder: Secondary | ICD-10-CM | POA: Diagnosis not present

## 2016-05-06 DIAGNOSIS — L814 Other melanin hyperpigmentation: Secondary | ICD-10-CM | POA: Diagnosis not present

## 2016-05-17 DIAGNOSIS — Z23 Encounter for immunization: Secondary | ICD-10-CM | POA: Diagnosis not present

## 2016-05-17 DIAGNOSIS — Z0000 Encounter for general adult medical examination without abnormal findings: Secondary | ICD-10-CM | POA: Diagnosis not present

## 2016-05-17 DIAGNOSIS — Z6826 Body mass index (BMI) 26.0-26.9, adult: Secondary | ICD-10-CM | POA: Diagnosis not present

## 2016-05-17 DIAGNOSIS — L570 Actinic keratosis: Secondary | ICD-10-CM | POA: Diagnosis not present

## 2016-05-17 DIAGNOSIS — H269 Unspecified cataract: Secondary | ICD-10-CM | POA: Diagnosis not present

## 2016-05-17 DIAGNOSIS — G4709 Other insomnia: Secondary | ICD-10-CM | POA: Diagnosis not present

## 2016-05-17 DIAGNOSIS — E782 Mixed hyperlipidemia: Secondary | ICD-10-CM | POA: Diagnosis not present

## 2016-05-17 DIAGNOSIS — L299 Pruritus, unspecified: Secondary | ICD-10-CM | POA: Diagnosis not present

## 2016-06-12 DIAGNOSIS — H34812 Central retinal vein occlusion, left eye, with macular edema: Secondary | ICD-10-CM | POA: Diagnosis not present

## 2016-07-09 DIAGNOSIS — H43813 Vitreous degeneration, bilateral: Secondary | ICD-10-CM | POA: Diagnosis not present

## 2016-07-09 DIAGNOSIS — H35371 Puckering of macula, right eye: Secondary | ICD-10-CM | POA: Diagnosis not present

## 2016-07-09 DIAGNOSIS — H3582 Retinal ischemia: Secondary | ICD-10-CM | POA: Diagnosis not present

## 2016-07-09 DIAGNOSIS — H34812 Central retinal vein occlusion, left eye, with macular edema: Secondary | ICD-10-CM | POA: Diagnosis not present

## 2016-07-24 DIAGNOSIS — H34812 Central retinal vein occlusion, left eye, with macular edema: Secondary | ICD-10-CM | POA: Diagnosis not present

## 2016-08-21 DIAGNOSIS — H35371 Puckering of macula, right eye: Secondary | ICD-10-CM | POA: Diagnosis not present

## 2016-08-21 DIAGNOSIS — H43813 Vitreous degeneration, bilateral: Secondary | ICD-10-CM | POA: Diagnosis not present

## 2016-08-21 DIAGNOSIS — H3582 Retinal ischemia: Secondary | ICD-10-CM | POA: Diagnosis not present

## 2016-08-21 DIAGNOSIS — H34812 Central retinal vein occlusion, left eye, with macular edema: Secondary | ICD-10-CM | POA: Diagnosis not present

## 2016-09-04 DIAGNOSIS — H34812 Central retinal vein occlusion, left eye, with macular edema: Secondary | ICD-10-CM | POA: Diagnosis not present

## 2016-10-20 DIAGNOSIS — H34812 Central retinal vein occlusion, left eye, with macular edema: Secondary | ICD-10-CM | POA: Diagnosis not present

## 2016-10-20 DIAGNOSIS — H3582 Retinal ischemia: Secondary | ICD-10-CM | POA: Diagnosis not present

## 2016-10-20 DIAGNOSIS — H43813 Vitreous degeneration, bilateral: Secondary | ICD-10-CM | POA: Diagnosis not present

## 2016-10-20 DIAGNOSIS — H35371 Puckering of macula, right eye: Secondary | ICD-10-CM | POA: Diagnosis not present

## 2016-12-22 DIAGNOSIS — H43813 Vitreous degeneration, bilateral: Secondary | ICD-10-CM | POA: Diagnosis not present

## 2016-12-22 DIAGNOSIS — H3582 Retinal ischemia: Secondary | ICD-10-CM | POA: Diagnosis not present

## 2016-12-22 DIAGNOSIS — H35371 Puckering of macula, right eye: Secondary | ICD-10-CM | POA: Diagnosis not present

## 2016-12-22 DIAGNOSIS — H34812 Central retinal vein occlusion, left eye, with macular edema: Secondary | ICD-10-CM | POA: Diagnosis not present

## 2017-01-29 DIAGNOSIS — H3582 Retinal ischemia: Secondary | ICD-10-CM | POA: Diagnosis not present

## 2017-01-29 DIAGNOSIS — H35371 Puckering of macula, right eye: Secondary | ICD-10-CM | POA: Diagnosis not present

## 2017-01-29 DIAGNOSIS — H34812 Central retinal vein occlusion, left eye, with macular edema: Secondary | ICD-10-CM | POA: Diagnosis not present

## 2017-01-29 DIAGNOSIS — H43813 Vitreous degeneration, bilateral: Secondary | ICD-10-CM | POA: Diagnosis not present

## 2017-03-12 DIAGNOSIS — H3582 Retinal ischemia: Secondary | ICD-10-CM | POA: Diagnosis not present

## 2017-03-12 DIAGNOSIS — H35371 Puckering of macula, right eye: Secondary | ICD-10-CM | POA: Diagnosis not present

## 2017-03-12 DIAGNOSIS — H43813 Vitreous degeneration, bilateral: Secondary | ICD-10-CM | POA: Diagnosis not present

## 2017-03-12 DIAGNOSIS — H34812 Central retinal vein occlusion, left eye, with macular edema: Secondary | ICD-10-CM | POA: Diagnosis not present

## 2017-04-30 DIAGNOSIS — H35371 Puckering of macula, right eye: Secondary | ICD-10-CM | POA: Diagnosis not present

## 2017-04-30 DIAGNOSIS — H34812 Central retinal vein occlusion, left eye, with macular edema: Secondary | ICD-10-CM | POA: Diagnosis not present

## 2017-04-30 DIAGNOSIS — H35033 Hypertensive retinopathy, bilateral: Secondary | ICD-10-CM | POA: Diagnosis not present

## 2017-05-14 DIAGNOSIS — E782 Mixed hyperlipidemia: Secondary | ICD-10-CM | POA: Diagnosis not present

## 2017-05-14 DIAGNOSIS — R5383 Other fatigue: Secondary | ICD-10-CM | POA: Diagnosis not present

## 2017-05-18 DIAGNOSIS — G4709 Other insomnia: Secondary | ICD-10-CM | POA: Diagnosis not present

## 2017-05-18 DIAGNOSIS — M545 Low back pain: Secondary | ICD-10-CM | POA: Diagnosis not present

## 2017-05-18 DIAGNOSIS — H34812 Central retinal vein occlusion, left eye, with macular edema: Secondary | ICD-10-CM | POA: Diagnosis not present

## 2017-05-18 DIAGNOSIS — Z0000 Encounter for general adult medical examination without abnormal findings: Secondary | ICD-10-CM | POA: Diagnosis not present

## 2017-05-18 DIAGNOSIS — Z6825 Body mass index (BMI) 25.0-25.9, adult: Secondary | ICD-10-CM | POA: Diagnosis not present

## 2017-05-18 DIAGNOSIS — E782 Mixed hyperlipidemia: Secondary | ICD-10-CM | POA: Diagnosis not present

## 2017-05-18 DIAGNOSIS — Z23 Encounter for immunization: Secondary | ICD-10-CM | POA: Diagnosis not present

## 2017-06-18 DIAGNOSIS — H35033 Hypertensive retinopathy, bilateral: Secondary | ICD-10-CM | POA: Diagnosis not present

## 2017-06-18 DIAGNOSIS — H35371 Puckering of macula, right eye: Secondary | ICD-10-CM | POA: Diagnosis not present

## 2017-06-18 DIAGNOSIS — H34812 Central retinal vein occlusion, left eye, with macular edema: Secondary | ICD-10-CM | POA: Diagnosis not present

## 2017-08-13 DIAGNOSIS — H35033 Hypertensive retinopathy, bilateral: Secondary | ICD-10-CM | POA: Diagnosis not present

## 2017-08-13 DIAGNOSIS — H35371 Puckering of macula, right eye: Secondary | ICD-10-CM | POA: Diagnosis not present

## 2017-08-13 DIAGNOSIS — H3582 Retinal ischemia: Secondary | ICD-10-CM | POA: Diagnosis not present

## 2017-08-13 DIAGNOSIS — H34812 Central retinal vein occlusion, left eye, with macular edema: Secondary | ICD-10-CM | POA: Diagnosis not present

## 2017-10-01 DIAGNOSIS — H35371 Puckering of macula, right eye: Secondary | ICD-10-CM | POA: Diagnosis not present

## 2017-10-01 DIAGNOSIS — H35033 Hypertensive retinopathy, bilateral: Secondary | ICD-10-CM | POA: Diagnosis not present

## 2017-10-01 DIAGNOSIS — H34812 Central retinal vein occlusion, left eye, with macular edema: Secondary | ICD-10-CM | POA: Diagnosis not present

## 2017-10-01 DIAGNOSIS — H35361 Drusen (degenerative) of macula, right eye: Secondary | ICD-10-CM | POA: Diagnosis not present

## 2017-11-19 DIAGNOSIS — H35033 Hypertensive retinopathy, bilateral: Secondary | ICD-10-CM | POA: Diagnosis not present

## 2017-11-19 DIAGNOSIS — H35371 Puckering of macula, right eye: Secondary | ICD-10-CM | POA: Diagnosis not present

## 2017-11-19 DIAGNOSIS — H3582 Retinal ischemia: Secondary | ICD-10-CM | POA: Diagnosis not present

## 2017-11-19 DIAGNOSIS — H34812 Central retinal vein occlusion, left eye, with macular edema: Secondary | ICD-10-CM | POA: Diagnosis not present

## 2018-01-07 DIAGNOSIS — H35371 Puckering of macula, right eye: Secondary | ICD-10-CM | POA: Diagnosis not present

## 2018-01-07 DIAGNOSIS — H3582 Retinal ischemia: Secondary | ICD-10-CM | POA: Diagnosis not present

## 2018-01-07 DIAGNOSIS — H34812 Central retinal vein occlusion, left eye, with macular edema: Secondary | ICD-10-CM | POA: Diagnosis not present

## 2018-01-07 DIAGNOSIS — H35033 Hypertensive retinopathy, bilateral: Secondary | ICD-10-CM | POA: Diagnosis not present

## 2018-03-04 DIAGNOSIS — H35371 Puckering of macula, right eye: Secondary | ICD-10-CM | POA: Diagnosis not present

## 2018-03-04 DIAGNOSIS — H43813 Vitreous degeneration, bilateral: Secondary | ICD-10-CM | POA: Diagnosis not present

## 2018-03-04 DIAGNOSIS — H34812 Central retinal vein occlusion, left eye, with macular edema: Secondary | ICD-10-CM | POA: Diagnosis not present

## 2018-03-04 DIAGNOSIS — H35033 Hypertensive retinopathy, bilateral: Secondary | ICD-10-CM | POA: Diagnosis not present

## 2018-04-22 DIAGNOSIS — H35371 Puckering of macula, right eye: Secondary | ICD-10-CM | POA: Diagnosis not present

## 2018-04-22 DIAGNOSIS — H35033 Hypertensive retinopathy, bilateral: Secondary | ICD-10-CM | POA: Diagnosis not present

## 2018-04-22 DIAGNOSIS — H43813 Vitreous degeneration, bilateral: Secondary | ICD-10-CM | POA: Diagnosis not present

## 2018-04-22 DIAGNOSIS — H34812 Central retinal vein occlusion, left eye, with macular edema: Secondary | ICD-10-CM | POA: Diagnosis not present

## 2018-05-17 DIAGNOSIS — F5221 Male erectile disorder: Secondary | ICD-10-CM | POA: Diagnosis not present

## 2018-05-17 DIAGNOSIS — E559 Vitamin D deficiency, unspecified: Secondary | ICD-10-CM | POA: Diagnosis not present

## 2018-05-17 DIAGNOSIS — E782 Mixed hyperlipidemia: Secondary | ICD-10-CM | POA: Diagnosis not present

## 2018-05-17 DIAGNOSIS — R5383 Other fatigue: Secondary | ICD-10-CM | POA: Diagnosis not present

## 2018-05-25 DIAGNOSIS — E782 Mixed hyperlipidemia: Secondary | ICD-10-CM | POA: Diagnosis not present

## 2018-05-25 DIAGNOSIS — Z6825 Body mass index (BMI) 25.0-25.9, adult: Secondary | ICD-10-CM | POA: Diagnosis not present

## 2018-05-25 DIAGNOSIS — Z0000 Encounter for general adult medical examination without abnormal findings: Secondary | ICD-10-CM | POA: Diagnosis not present

## 2018-05-25 DIAGNOSIS — Z23 Encounter for immunization: Secondary | ICD-10-CM | POA: Diagnosis not present

## 2018-06-10 DIAGNOSIS — H3582 Retinal ischemia: Secondary | ICD-10-CM | POA: Diagnosis not present

## 2018-06-10 DIAGNOSIS — H35371 Puckering of macula, right eye: Secondary | ICD-10-CM | POA: Diagnosis not present

## 2018-06-10 DIAGNOSIS — H34812 Central retinal vein occlusion, left eye, with macular edema: Secondary | ICD-10-CM | POA: Diagnosis not present

## 2018-06-10 DIAGNOSIS — H35033 Hypertensive retinopathy, bilateral: Secondary | ICD-10-CM | POA: Diagnosis not present

## 2018-07-29 DIAGNOSIS — H35361 Drusen (degenerative) of macula, right eye: Secondary | ICD-10-CM | POA: Diagnosis not present

## 2018-07-29 DIAGNOSIS — H35033 Hypertensive retinopathy, bilateral: Secondary | ICD-10-CM | POA: Diagnosis not present

## 2018-07-29 DIAGNOSIS — H34812 Central retinal vein occlusion, left eye, with macular edema: Secondary | ICD-10-CM | POA: Diagnosis not present

## 2018-07-29 DIAGNOSIS — H35371 Puckering of macula, right eye: Secondary | ICD-10-CM | POA: Diagnosis not present

## 2018-09-16 DIAGNOSIS — H34812 Central retinal vein occlusion, left eye, with macular edema: Secondary | ICD-10-CM | POA: Diagnosis not present

## 2018-09-16 DIAGNOSIS — H35371 Puckering of macula, right eye: Secondary | ICD-10-CM | POA: Diagnosis not present

## 2018-09-16 DIAGNOSIS — H35033 Hypertensive retinopathy, bilateral: Secondary | ICD-10-CM | POA: Diagnosis not present

## 2018-09-16 DIAGNOSIS — H35361 Drusen (degenerative) of macula, right eye: Secondary | ICD-10-CM | POA: Diagnosis not present

## 2018-11-04 DIAGNOSIS — H3582 Retinal ischemia: Secondary | ICD-10-CM | POA: Diagnosis not present

## 2018-11-04 DIAGNOSIS — H35033 Hypertensive retinopathy, bilateral: Secondary | ICD-10-CM | POA: Diagnosis not present

## 2018-11-04 DIAGNOSIS — H35371 Puckering of macula, right eye: Secondary | ICD-10-CM | POA: Diagnosis not present

## 2018-11-04 DIAGNOSIS — H34812 Central retinal vein occlusion, left eye, with macular edema: Secondary | ICD-10-CM | POA: Diagnosis not present

## 2018-12-30 DIAGNOSIS — H34812 Central retinal vein occlusion, left eye, with macular edema: Secondary | ICD-10-CM | POA: Diagnosis not present

## 2018-12-30 DIAGNOSIS — H35371 Puckering of macula, right eye: Secondary | ICD-10-CM | POA: Diagnosis not present

## 2019-02-24 DIAGNOSIS — H43813 Vitreous degeneration, bilateral: Secondary | ICD-10-CM | POA: Diagnosis not present

## 2019-02-24 DIAGNOSIS — H35371 Puckering of macula, right eye: Secondary | ICD-10-CM | POA: Diagnosis not present

## 2019-02-24 DIAGNOSIS — H35033 Hypertensive retinopathy, bilateral: Secondary | ICD-10-CM | POA: Diagnosis not present

## 2019-02-24 DIAGNOSIS — H34812 Central retinal vein occlusion, left eye, with macular edema: Secondary | ICD-10-CM | POA: Diagnosis not present

## 2019-04-21 DIAGNOSIS — H34812 Central retinal vein occlusion, left eye, with macular edema: Secondary | ICD-10-CM | POA: Diagnosis not present

## 2019-05-27 DIAGNOSIS — M1990 Unspecified osteoarthritis, unspecified site: Secondary | ICD-10-CM | POA: Diagnosis not present

## 2019-05-27 DIAGNOSIS — E782 Mixed hyperlipidemia: Secondary | ICD-10-CM | POA: Diagnosis not present

## 2019-05-27 DIAGNOSIS — R5383 Other fatigue: Secondary | ICD-10-CM | POA: Diagnosis not present

## 2019-05-27 DIAGNOSIS — L814 Other melanin hyperpigmentation: Secondary | ICD-10-CM | POA: Diagnosis not present

## 2019-06-01 DIAGNOSIS — Z1212 Encounter for screening for malignant neoplasm of rectum: Secondary | ICD-10-CM | POA: Diagnosis not present

## 2019-06-01 DIAGNOSIS — Z23 Encounter for immunization: Secondary | ICD-10-CM | POA: Diagnosis not present

## 2019-06-01 DIAGNOSIS — M545 Low back pain: Secondary | ICD-10-CM | POA: Diagnosis not present

## 2019-06-01 DIAGNOSIS — Z6825 Body mass index (BMI) 25.0-25.9, adult: Secondary | ICD-10-CM | POA: Diagnosis not present

## 2019-06-01 DIAGNOSIS — R001 Bradycardia, unspecified: Secondary | ICD-10-CM | POA: Diagnosis not present

## 2019-06-01 DIAGNOSIS — Z0001 Encounter for general adult medical examination with abnormal findings: Secondary | ICD-10-CM | POA: Diagnosis not present

## 2019-06-01 DIAGNOSIS — R011 Cardiac murmur, unspecified: Secondary | ICD-10-CM | POA: Diagnosis not present

## 2019-06-01 DIAGNOSIS — E782 Mixed hyperlipidemia: Secondary | ICD-10-CM | POA: Diagnosis not present

## 2019-06-06 DIAGNOSIS — R001 Bradycardia, unspecified: Secondary | ICD-10-CM | POA: Diagnosis not present

## 2019-06-06 DIAGNOSIS — R011 Cardiac murmur, unspecified: Secondary | ICD-10-CM | POA: Diagnosis not present

## 2019-06-23 DIAGNOSIS — H35371 Puckering of macula, right eye: Secondary | ICD-10-CM | POA: Diagnosis not present

## 2019-06-23 DIAGNOSIS — H34812 Central retinal vein occlusion, left eye, with macular edema: Secondary | ICD-10-CM | POA: Diagnosis not present

## 2019-06-23 DIAGNOSIS — H43813 Vitreous degeneration, bilateral: Secondary | ICD-10-CM | POA: Diagnosis not present

## 2019-06-23 DIAGNOSIS — H35033 Hypertensive retinopathy, bilateral: Secondary | ICD-10-CM | POA: Diagnosis not present

## 2019-08-17 DIAGNOSIS — H35371 Puckering of macula, right eye: Secondary | ICD-10-CM | POA: Diagnosis not present

## 2019-08-17 DIAGNOSIS — H34812 Central retinal vein occlusion, left eye, with macular edema: Secondary | ICD-10-CM | POA: Diagnosis not present

## 2019-08-30 DIAGNOSIS — E782 Mixed hyperlipidemia: Secondary | ICD-10-CM | POA: Diagnosis not present

## 2019-08-30 DIAGNOSIS — R001 Bradycardia, unspecified: Secondary | ICD-10-CM | POA: Diagnosis not present

## 2019-08-30 DIAGNOSIS — M722 Plantar fascial fibromatosis: Secondary | ICD-10-CM | POA: Diagnosis not present

## 2019-08-30 DIAGNOSIS — Z6825 Body mass index (BMI) 25.0-25.9, adult: Secondary | ICD-10-CM | POA: Diagnosis not present

## 2019-08-30 DIAGNOSIS — I5030 Unspecified diastolic (congestive) heart failure: Secondary | ICD-10-CM | POA: Diagnosis not present

## 2019-10-27 DIAGNOSIS — H35371 Puckering of macula, right eye: Secondary | ICD-10-CM | POA: Diagnosis not present

## 2019-10-27 DIAGNOSIS — H3582 Retinal ischemia: Secondary | ICD-10-CM | POA: Diagnosis not present

## 2019-10-27 DIAGNOSIS — H34812 Central retinal vein occlusion, left eye, with macular edema: Secondary | ICD-10-CM | POA: Diagnosis not present

## 2019-10-27 DIAGNOSIS — H35033 Hypertensive retinopathy, bilateral: Secondary | ICD-10-CM | POA: Diagnosis not present

## 2019-11-01 DIAGNOSIS — Z6826 Body mass index (BMI) 26.0-26.9, adult: Secondary | ICD-10-CM | POA: Diagnosis not present

## 2019-11-01 DIAGNOSIS — C44529 Squamous cell carcinoma of skin of other part of trunk: Secondary | ICD-10-CM | POA: Diagnosis not present

## 2019-11-05 DIAGNOSIS — D485 Neoplasm of uncertain behavior of skin: Secondary | ICD-10-CM | POA: Diagnosis not present

## 2019-11-05 DIAGNOSIS — C4359 Malignant melanoma of other part of trunk: Secondary | ICD-10-CM | POA: Diagnosis not present

## 2019-11-14 DIAGNOSIS — C4359 Malignant melanoma of other part of trunk: Secondary | ICD-10-CM | POA: Diagnosis not present

## 2019-11-23 DIAGNOSIS — C4359 Malignant melanoma of other part of trunk: Secondary | ICD-10-CM | POA: Diagnosis not present

## 2019-11-23 DIAGNOSIS — D485 Neoplasm of uncertain behavior of skin: Secondary | ICD-10-CM | POA: Diagnosis not present

## 2019-11-24 DIAGNOSIS — C4359 Malignant melanoma of other part of trunk: Secondary | ICD-10-CM | POA: Diagnosis not present

## 2019-11-30 DIAGNOSIS — C4359 Malignant melanoma of other part of trunk: Secondary | ICD-10-CM | POA: Diagnosis not present

## 2019-12-07 DIAGNOSIS — Z20822 Contact with and (suspected) exposure to covid-19: Secondary | ICD-10-CM | POA: Diagnosis not present

## 2019-12-07 DIAGNOSIS — Z01812 Encounter for preprocedural laboratory examination: Secondary | ICD-10-CM | POA: Diagnosis not present

## 2019-12-09 DIAGNOSIS — C4359 Malignant melanoma of other part of trunk: Secondary | ICD-10-CM | POA: Diagnosis not present

## 2019-12-09 DIAGNOSIS — Z87891 Personal history of nicotine dependence: Secondary | ICD-10-CM | POA: Diagnosis not present

## 2019-12-09 DIAGNOSIS — Z85828 Personal history of other malignant neoplasm of skin: Secondary | ICD-10-CM | POA: Diagnosis not present

## 2019-12-19 DIAGNOSIS — R948 Abnormal results of function studies of other organs and systems: Secondary | ICD-10-CM | POA: Diagnosis not present

## 2019-12-19 DIAGNOSIS — Z09 Encounter for follow-up examination after completed treatment for conditions other than malignant neoplasm: Secondary | ICD-10-CM | POA: Diagnosis not present

## 2019-12-29 ENCOUNTER — Encounter: Payer: Self-pay | Admitting: *Deleted

## 2020-01-05 DIAGNOSIS — H34812 Central retinal vein occlusion, left eye, with macular edema: Secondary | ICD-10-CM | POA: Diagnosis not present

## 2020-02-03 DIAGNOSIS — K5730 Diverticulosis of large intestine without perforation or abscess without bleeding: Secondary | ICD-10-CM | POA: Diagnosis not present

## 2020-02-03 DIAGNOSIS — K3189 Other diseases of stomach and duodenum: Secondary | ICD-10-CM | POA: Diagnosis not present

## 2020-02-03 DIAGNOSIS — K635 Polyp of colon: Secondary | ICD-10-CM | POA: Diagnosis not present

## 2020-02-03 DIAGNOSIS — D122 Benign neoplasm of ascending colon: Secondary | ICD-10-CM | POA: Diagnosis not present

## 2020-02-03 DIAGNOSIS — R933 Abnormal findings on diagnostic imaging of other parts of digestive tract: Secondary | ICD-10-CM | POA: Diagnosis not present

## 2020-02-03 DIAGNOSIS — D128 Benign neoplasm of rectum: Secondary | ICD-10-CM | POA: Diagnosis not present

## 2020-02-03 DIAGNOSIS — D127 Benign neoplasm of rectosigmoid junction: Secondary | ICD-10-CM | POA: Diagnosis not present

## 2020-03-07 DIAGNOSIS — R011 Cardiac murmur, unspecified: Secondary | ICD-10-CM | POA: Diagnosis not present

## 2020-03-07 DIAGNOSIS — I5030 Unspecified diastolic (congestive) heart failure: Secondary | ICD-10-CM | POA: Diagnosis not present

## 2020-03-07 DIAGNOSIS — R5383 Other fatigue: Secondary | ICD-10-CM | POA: Diagnosis not present

## 2020-03-07 DIAGNOSIS — E782 Mixed hyperlipidemia: Secondary | ICD-10-CM | POA: Diagnosis not present

## 2020-03-07 DIAGNOSIS — E559 Vitamin D deficiency, unspecified: Secondary | ICD-10-CM | POA: Diagnosis not present

## 2020-03-07 DIAGNOSIS — M1990 Unspecified osteoarthritis, unspecified site: Secondary | ICD-10-CM | POA: Diagnosis not present

## 2020-03-08 DIAGNOSIS — H35371 Puckering of macula, right eye: Secondary | ICD-10-CM | POA: Diagnosis not present

## 2020-03-08 DIAGNOSIS — H3582 Retinal ischemia: Secondary | ICD-10-CM | POA: Diagnosis not present

## 2020-03-08 DIAGNOSIS — H35033 Hypertensive retinopathy, bilateral: Secondary | ICD-10-CM | POA: Diagnosis not present

## 2020-03-08 DIAGNOSIS — H34812 Central retinal vein occlusion, left eye, with macular edema: Secondary | ICD-10-CM | POA: Diagnosis not present

## 2020-03-14 DIAGNOSIS — E782 Mixed hyperlipidemia: Secondary | ICD-10-CM | POA: Diagnosis not present

## 2020-03-14 DIAGNOSIS — C439 Malignant melanoma of skin, unspecified: Secondary | ICD-10-CM | POA: Diagnosis not present

## 2020-03-14 DIAGNOSIS — Z6825 Body mass index (BMI) 25.0-25.9, adult: Secondary | ICD-10-CM | POA: Diagnosis not present

## 2020-03-14 DIAGNOSIS — I5030 Unspecified diastolic (congestive) heart failure: Secondary | ICD-10-CM | POA: Diagnosis not present

## 2020-03-14 DIAGNOSIS — R001 Bradycardia, unspecified: Secondary | ICD-10-CM | POA: Diagnosis not present

## 2020-05-10 DIAGNOSIS — H34812 Central retinal vein occlusion, left eye, with macular edema: Secondary | ICD-10-CM | POA: Diagnosis not present

## 2020-05-10 DIAGNOSIS — H3582 Retinal ischemia: Secondary | ICD-10-CM | POA: Diagnosis not present

## 2020-05-10 DIAGNOSIS — H35371 Puckering of macula, right eye: Secondary | ICD-10-CM | POA: Diagnosis not present

## 2020-05-10 DIAGNOSIS — H35033 Hypertensive retinopathy, bilateral: Secondary | ICD-10-CM | POA: Diagnosis not present

## 2020-06-01 DIAGNOSIS — R011 Cardiac murmur, unspecified: Secondary | ICD-10-CM | POA: Diagnosis not present

## 2020-06-01 DIAGNOSIS — R5383 Other fatigue: Secondary | ICD-10-CM | POA: Diagnosis not present

## 2020-06-01 DIAGNOSIS — E782 Mixed hyperlipidemia: Secondary | ICD-10-CM | POA: Diagnosis not present

## 2020-06-01 DIAGNOSIS — Z0001 Encounter for general adult medical examination with abnormal findings: Secondary | ICD-10-CM | POA: Diagnosis not present

## 2020-06-12 DIAGNOSIS — C4359 Malignant melanoma of other part of trunk: Secondary | ICD-10-CM | POA: Diagnosis not present

## 2020-06-12 DIAGNOSIS — R001 Bradycardia, unspecified: Secondary | ICD-10-CM | POA: Diagnosis not present

## 2020-06-12 DIAGNOSIS — R011 Cardiac murmur, unspecified: Secondary | ICD-10-CM | POA: Diagnosis not present

## 2020-06-12 DIAGNOSIS — S93401A Sprain of unspecified ligament of right ankle, initial encounter: Secondary | ICD-10-CM | POA: Diagnosis not present

## 2020-06-12 DIAGNOSIS — Z0001 Encounter for general adult medical examination with abnormal findings: Secondary | ICD-10-CM | POA: Diagnosis not present

## 2020-06-12 DIAGNOSIS — Z23 Encounter for immunization: Secondary | ICD-10-CM | POA: Diagnosis not present

## 2020-06-12 DIAGNOSIS — I5030 Unspecified diastolic (congestive) heart failure: Secondary | ICD-10-CM | POA: Diagnosis not present

## 2020-06-12 DIAGNOSIS — E782 Mixed hyperlipidemia: Secondary | ICD-10-CM | POA: Diagnosis not present

## 2020-06-21 DIAGNOSIS — D0461 Carcinoma in situ of skin of right upper limb, including shoulder: Secondary | ICD-10-CM | POA: Diagnosis not present

## 2020-06-21 DIAGNOSIS — C44519 Basal cell carcinoma of skin of other part of trunk: Secondary | ICD-10-CM | POA: Diagnosis not present

## 2020-06-21 DIAGNOSIS — Z85820 Personal history of malignant melanoma of skin: Secondary | ICD-10-CM | POA: Diagnosis not present

## 2020-06-21 DIAGNOSIS — D2272 Melanocytic nevi of left lower limb, including hip: Secondary | ICD-10-CM | POA: Diagnosis not present

## 2020-06-21 DIAGNOSIS — D225 Melanocytic nevi of trunk: Secondary | ICD-10-CM | POA: Diagnosis not present

## 2020-06-21 DIAGNOSIS — L570 Actinic keratosis: Secondary | ICD-10-CM | POA: Diagnosis not present

## 2020-06-21 DIAGNOSIS — X32XXXA Exposure to sunlight, initial encounter: Secondary | ICD-10-CM | POA: Diagnosis not present

## 2020-06-21 DIAGNOSIS — D2262 Melanocytic nevi of left upper limb, including shoulder: Secondary | ICD-10-CM | POA: Diagnosis not present

## 2020-06-21 DIAGNOSIS — D2261 Melanocytic nevi of right upper limb, including shoulder: Secondary | ICD-10-CM | POA: Diagnosis not present

## 2020-06-21 DIAGNOSIS — D485 Neoplasm of uncertain behavior of skin: Secondary | ICD-10-CM | POA: Diagnosis not present

## 2020-06-27 DIAGNOSIS — C44519 Basal cell carcinoma of skin of other part of trunk: Secondary | ICD-10-CM | POA: Diagnosis not present

## 2020-06-27 DIAGNOSIS — D0461 Carcinoma in situ of skin of right upper limb, including shoulder: Secondary | ICD-10-CM | POA: Diagnosis not present

## 2020-08-06 DIAGNOSIS — D127 Benign neoplasm of rectosigmoid junction: Secondary | ICD-10-CM | POA: Diagnosis not present

## 2020-08-06 DIAGNOSIS — D126 Benign neoplasm of colon, unspecified: Secondary | ICD-10-CM | POA: Diagnosis not present

## 2020-08-06 DIAGNOSIS — C185 Malignant neoplasm of splenic flexure: Secondary | ICD-10-CM | POA: Diagnosis not present

## 2020-08-06 DIAGNOSIS — K640 First degree hemorrhoids: Secondary | ICD-10-CM | POA: Diagnosis not present

## 2020-08-06 DIAGNOSIS — K5730 Diverticulosis of large intestine without perforation or abscess without bleeding: Secondary | ICD-10-CM | POA: Diagnosis not present

## 2020-08-06 DIAGNOSIS — K621 Rectal polyp: Secondary | ICD-10-CM | POA: Diagnosis not present

## 2020-08-06 DIAGNOSIS — K635 Polyp of colon: Secondary | ICD-10-CM | POA: Diagnosis not present

## 2020-08-06 DIAGNOSIS — D120 Benign neoplasm of cecum: Secondary | ICD-10-CM | POA: Diagnosis not present

## 2020-08-06 DIAGNOSIS — D125 Benign neoplasm of sigmoid colon: Secondary | ICD-10-CM | POA: Diagnosis not present

## 2020-08-06 DIAGNOSIS — Z86010 Personal history of colonic polyps: Secondary | ICD-10-CM | POA: Diagnosis not present

## 2020-08-06 DIAGNOSIS — Z1211 Encounter for screening for malignant neoplasm of colon: Secondary | ICD-10-CM | POA: Diagnosis not present

## 2020-08-06 DIAGNOSIS — D122 Benign neoplasm of ascending colon: Secondary | ICD-10-CM | POA: Diagnosis not present

## 2020-08-23 DIAGNOSIS — H34812 Central retinal vein occlusion, left eye, with macular edema: Secondary | ICD-10-CM | POA: Diagnosis not present

## 2020-08-23 DIAGNOSIS — H35371 Puckering of macula, right eye: Secondary | ICD-10-CM | POA: Diagnosis not present

## 2020-10-11 DIAGNOSIS — H35033 Hypertensive retinopathy, bilateral: Secondary | ICD-10-CM | POA: Diagnosis not present

## 2020-10-11 DIAGNOSIS — H35371 Puckering of macula, right eye: Secondary | ICD-10-CM | POA: Diagnosis not present

## 2020-10-11 DIAGNOSIS — H3582 Retinal ischemia: Secondary | ICD-10-CM | POA: Diagnosis not present

## 2020-10-11 DIAGNOSIS — H34812 Central retinal vein occlusion, left eye, with macular edema: Secondary | ICD-10-CM | POA: Diagnosis not present

## 2020-11-08 DIAGNOSIS — L570 Actinic keratosis: Secondary | ICD-10-CM | POA: Diagnosis not present

## 2020-11-08 DIAGNOSIS — X32XXXA Exposure to sunlight, initial encounter: Secondary | ICD-10-CM | POA: Diagnosis not present

## 2020-11-08 DIAGNOSIS — D225 Melanocytic nevi of trunk: Secondary | ICD-10-CM | POA: Diagnosis not present

## 2020-11-08 DIAGNOSIS — Z85828 Personal history of other malignant neoplasm of skin: Secondary | ICD-10-CM | POA: Diagnosis not present

## 2020-11-08 DIAGNOSIS — Z85820 Personal history of malignant melanoma of skin: Secondary | ICD-10-CM | POA: Diagnosis not present

## 2020-11-08 DIAGNOSIS — D2271 Melanocytic nevi of right lower limb, including hip: Secondary | ICD-10-CM | POA: Diagnosis not present

## 2020-11-08 DIAGNOSIS — D2261 Melanocytic nevi of right upper limb, including shoulder: Secondary | ICD-10-CM | POA: Diagnosis not present

## 2020-11-08 DIAGNOSIS — L821 Other seborrheic keratosis: Secondary | ICD-10-CM | POA: Diagnosis not present

## 2020-11-30 DIAGNOSIS — E7849 Other hyperlipidemia: Secondary | ICD-10-CM | POA: Diagnosis not present

## 2020-11-30 DIAGNOSIS — Z1329 Encounter for screening for other suspected endocrine disorder: Secondary | ICD-10-CM | POA: Diagnosis not present

## 2020-11-30 DIAGNOSIS — E782 Mixed hyperlipidemia: Secondary | ICD-10-CM | POA: Diagnosis not present

## 2020-11-30 DIAGNOSIS — R011 Cardiac murmur, unspecified: Secondary | ICD-10-CM | POA: Diagnosis not present

## 2020-11-30 DIAGNOSIS — R5383 Other fatigue: Secondary | ICD-10-CM | POA: Diagnosis not present

## 2020-12-06 DIAGNOSIS — H34812 Central retinal vein occlusion, left eye, with macular edema: Secondary | ICD-10-CM | POA: Diagnosis not present

## 2020-12-10 DIAGNOSIS — E7849 Other hyperlipidemia: Secondary | ICD-10-CM | POA: Diagnosis not present

## 2020-12-10 DIAGNOSIS — Z6826 Body mass index (BMI) 26.0-26.9, adult: Secondary | ICD-10-CM | POA: Diagnosis not present

## 2020-12-10 DIAGNOSIS — R011 Cardiac murmur, unspecified: Secondary | ICD-10-CM | POA: Diagnosis not present

## 2020-12-10 DIAGNOSIS — C4359 Malignant melanoma of other part of trunk: Secondary | ICD-10-CM | POA: Diagnosis not present

## 2020-12-10 DIAGNOSIS — I5030 Unspecified diastolic (congestive) heart failure: Secondary | ICD-10-CM | POA: Diagnosis not present

## 2021-02-08 DIAGNOSIS — H34812 Central retinal vein occlusion, left eye, with macular edema: Secondary | ICD-10-CM | POA: Diagnosis not present

## 2021-02-08 DIAGNOSIS — H35033 Hypertensive retinopathy, bilateral: Secondary | ICD-10-CM | POA: Diagnosis not present

## 2021-02-08 DIAGNOSIS — H35363 Drusen (degenerative) of macula, bilateral: Secondary | ICD-10-CM | POA: Diagnosis not present

## 2021-02-08 DIAGNOSIS — H35371 Puckering of macula, right eye: Secondary | ICD-10-CM | POA: Diagnosis not present

## 2021-04-03 DIAGNOSIS — H34812 Central retinal vein occlusion, left eye, with macular edema: Secondary | ICD-10-CM | POA: Diagnosis not present

## 2021-04-03 DIAGNOSIS — H35371 Puckering of macula, right eye: Secondary | ICD-10-CM | POA: Diagnosis not present

## 2021-05-13 DIAGNOSIS — D045 Carcinoma in situ of skin of trunk: Secondary | ICD-10-CM | POA: Diagnosis not present

## 2021-05-13 DIAGNOSIS — Z85820 Personal history of malignant melanoma of skin: Secondary | ICD-10-CM | POA: Diagnosis not present

## 2021-05-13 DIAGNOSIS — D225 Melanocytic nevi of trunk: Secondary | ICD-10-CM | POA: Diagnosis not present

## 2021-05-13 DIAGNOSIS — D0461 Carcinoma in situ of skin of right upper limb, including shoulder: Secondary | ICD-10-CM | POA: Diagnosis not present

## 2021-05-13 DIAGNOSIS — D485 Neoplasm of uncertain behavior of skin: Secondary | ICD-10-CM | POA: Diagnosis not present

## 2021-05-13 DIAGNOSIS — Z85828 Personal history of other malignant neoplasm of skin: Secondary | ICD-10-CM | POA: Diagnosis not present

## 2021-05-13 DIAGNOSIS — D2262 Melanocytic nevi of left upper limb, including shoulder: Secondary | ICD-10-CM | POA: Diagnosis not present

## 2021-05-13 DIAGNOSIS — D2271 Melanocytic nevi of right lower limb, including hip: Secondary | ICD-10-CM | POA: Diagnosis not present

## 2021-05-13 DIAGNOSIS — L570 Actinic keratosis: Secondary | ICD-10-CM | POA: Diagnosis not present

## 2021-06-06 DIAGNOSIS — H34812 Central retinal vein occlusion, left eye, with macular edema: Secondary | ICD-10-CM | POA: Diagnosis not present

## 2021-06-06 DIAGNOSIS — H35371 Puckering of macula, right eye: Secondary | ICD-10-CM | POA: Diagnosis not present

## 2021-06-06 DIAGNOSIS — H353122 Nonexudative age-related macular degeneration, left eye, intermediate dry stage: Secondary | ICD-10-CM | POA: Diagnosis not present

## 2021-06-06 DIAGNOSIS — H35361 Drusen (degenerative) of macula, right eye: Secondary | ICD-10-CM | POA: Diagnosis not present

## 2021-06-10 DIAGNOSIS — D0461 Carcinoma in situ of skin of right upper limb, including shoulder: Secondary | ICD-10-CM | POA: Diagnosis not present

## 2021-06-10 DIAGNOSIS — D045 Carcinoma in situ of skin of trunk: Secondary | ICD-10-CM | POA: Diagnosis not present

## 2021-06-11 DIAGNOSIS — E559 Vitamin D deficiency, unspecified: Secondary | ICD-10-CM | POA: Diagnosis not present

## 2021-06-11 DIAGNOSIS — E782 Mixed hyperlipidemia: Secondary | ICD-10-CM | POA: Diagnosis not present

## 2021-06-11 DIAGNOSIS — E7849 Other hyperlipidemia: Secondary | ICD-10-CM | POA: Diagnosis not present

## 2021-06-13 DIAGNOSIS — Z23 Encounter for immunization: Secondary | ICD-10-CM | POA: Diagnosis not present

## 2021-06-13 DIAGNOSIS — C4359 Malignant melanoma of other part of trunk: Secondary | ICD-10-CM | POA: Diagnosis not present

## 2021-06-13 DIAGNOSIS — Z6826 Body mass index (BMI) 26.0-26.9, adult: Secondary | ICD-10-CM | POA: Diagnosis not present

## 2021-06-13 DIAGNOSIS — E559 Vitamin D deficiency, unspecified: Secondary | ICD-10-CM | POA: Diagnosis not present

## 2021-06-13 DIAGNOSIS — I5030 Unspecified diastolic (congestive) heart failure: Secondary | ICD-10-CM | POA: Diagnosis not present

## 2021-06-13 DIAGNOSIS — R011 Cardiac murmur, unspecified: Secondary | ICD-10-CM | POA: Diagnosis not present

## 2021-06-13 DIAGNOSIS — E7849 Other hyperlipidemia: Secondary | ICD-10-CM | POA: Diagnosis not present

## 2021-06-13 DIAGNOSIS — Z0001 Encounter for general adult medical examination with abnormal findings: Secondary | ICD-10-CM | POA: Diagnosis not present

## 2021-08-08 DIAGNOSIS — H35371 Puckering of macula, right eye: Secondary | ICD-10-CM | POA: Diagnosis not present

## 2021-08-08 DIAGNOSIS — H34812 Central retinal vein occlusion, left eye, with macular edema: Secondary | ICD-10-CM | POA: Diagnosis not present

## 2021-08-08 DIAGNOSIS — H43813 Vitreous degeneration, bilateral: Secondary | ICD-10-CM | POA: Diagnosis not present

## 2021-08-08 DIAGNOSIS — H353132 Nonexudative age-related macular degeneration, bilateral, intermediate dry stage: Secondary | ICD-10-CM | POA: Diagnosis not present

## 2021-10-17 DIAGNOSIS — H43813 Vitreous degeneration, bilateral: Secondary | ICD-10-CM | POA: Diagnosis not present

## 2021-10-17 DIAGNOSIS — H35371 Puckering of macula, right eye: Secondary | ICD-10-CM | POA: Diagnosis not present

## 2021-10-17 DIAGNOSIS — H34812 Central retinal vein occlusion, left eye, with macular edema: Secondary | ICD-10-CM | POA: Diagnosis not present

## 2021-10-17 DIAGNOSIS — H353122 Nonexudative age-related macular degeneration, left eye, intermediate dry stage: Secondary | ICD-10-CM | POA: Diagnosis not present

## 2021-10-17 DIAGNOSIS — H35032 Hypertensive retinopathy, left eye: Secondary | ICD-10-CM | POA: Diagnosis not present

## 2021-11-12 DIAGNOSIS — D225 Melanocytic nevi of trunk: Secondary | ICD-10-CM | POA: Diagnosis not present

## 2021-11-12 DIAGNOSIS — L821 Other seborrheic keratosis: Secondary | ICD-10-CM | POA: Diagnosis not present

## 2021-11-12 DIAGNOSIS — Z85820 Personal history of malignant melanoma of skin: Secondary | ICD-10-CM | POA: Diagnosis not present

## 2021-11-12 DIAGNOSIS — Z85828 Personal history of other malignant neoplasm of skin: Secondary | ICD-10-CM | POA: Diagnosis not present

## 2021-11-12 DIAGNOSIS — D2262 Melanocytic nevi of left upper limb, including shoulder: Secondary | ICD-10-CM | POA: Diagnosis not present

## 2021-11-12 DIAGNOSIS — L570 Actinic keratosis: Secondary | ICD-10-CM | POA: Diagnosis not present

## 2021-11-12 DIAGNOSIS — D2272 Melanocytic nevi of left lower limb, including hip: Secondary | ICD-10-CM | POA: Diagnosis not present

## 2021-11-12 DIAGNOSIS — X32XXXA Exposure to sunlight, initial encounter: Secondary | ICD-10-CM | POA: Diagnosis not present

## 2021-12-05 DIAGNOSIS — E782 Mixed hyperlipidemia: Secondary | ICD-10-CM | POA: Diagnosis not present

## 2021-12-05 DIAGNOSIS — R5383 Other fatigue: Secondary | ICD-10-CM | POA: Diagnosis not present

## 2021-12-05 DIAGNOSIS — E7849 Other hyperlipidemia: Secondary | ICD-10-CM | POA: Diagnosis not present

## 2021-12-05 DIAGNOSIS — I5030 Unspecified diastolic (congestive) heart failure: Secondary | ICD-10-CM | POA: Diagnosis not present

## 2021-12-05 DIAGNOSIS — Z1329 Encounter for screening for other suspected endocrine disorder: Secondary | ICD-10-CM | POA: Diagnosis not present

## 2021-12-10 DIAGNOSIS — R011 Cardiac murmur, unspecified: Secondary | ICD-10-CM | POA: Diagnosis not present

## 2021-12-10 DIAGNOSIS — Z6825 Body mass index (BMI) 25.0-25.9, adult: Secondary | ICD-10-CM | POA: Diagnosis not present

## 2021-12-10 DIAGNOSIS — I5030 Unspecified diastolic (congestive) heart failure: Secondary | ICD-10-CM | POA: Diagnosis not present

## 2021-12-10 DIAGNOSIS — I10 Essential (primary) hypertension: Secondary | ICD-10-CM | POA: Diagnosis not present

## 2021-12-10 DIAGNOSIS — E7849 Other hyperlipidemia: Secondary | ICD-10-CM | POA: Diagnosis not present

## 2021-12-11 DIAGNOSIS — F17210 Nicotine dependence, cigarettes, uncomplicated: Secondary | ICD-10-CM | POA: Diagnosis not present

## 2021-12-11 DIAGNOSIS — Z0001 Encounter for general adult medical examination with abnormal findings: Secondary | ICD-10-CM | POA: Diagnosis not present

## 2021-12-11 DIAGNOSIS — I5030 Unspecified diastolic (congestive) heart failure: Secondary | ICD-10-CM | POA: Diagnosis not present

## 2021-12-11 DIAGNOSIS — E7849 Other hyperlipidemia: Secondary | ICD-10-CM | POA: Diagnosis not present

## 2021-12-11 DIAGNOSIS — R011 Cardiac murmur, unspecified: Secondary | ICD-10-CM | POA: Diagnosis not present

## 2021-12-11 DIAGNOSIS — R001 Bradycardia, unspecified: Secondary | ICD-10-CM | POA: Diagnosis not present

## 2021-12-18 DIAGNOSIS — I517 Cardiomegaly: Secondary | ICD-10-CM | POA: Diagnosis not present

## 2021-12-18 DIAGNOSIS — I340 Nonrheumatic mitral (valve) insufficiency: Secondary | ICD-10-CM | POA: Diagnosis not present

## 2021-12-18 DIAGNOSIS — I5030 Unspecified diastolic (congestive) heart failure: Secondary | ICD-10-CM | POA: Diagnosis not present

## 2021-12-26 DIAGNOSIS — H43813 Vitreous degeneration, bilateral: Secondary | ICD-10-CM | POA: Diagnosis not present

## 2021-12-26 DIAGNOSIS — H353122 Nonexudative age-related macular degeneration, left eye, intermediate dry stage: Secondary | ICD-10-CM | POA: Diagnosis not present

## 2021-12-26 DIAGNOSIS — H35032 Hypertensive retinopathy, left eye: Secondary | ICD-10-CM | POA: Diagnosis not present

## 2021-12-26 DIAGNOSIS — H34812 Central retinal vein occlusion, left eye, with macular edema: Secondary | ICD-10-CM | POA: Diagnosis not present

## 2021-12-26 DIAGNOSIS — H35371 Puckering of macula, right eye: Secondary | ICD-10-CM | POA: Diagnosis not present

## 2022-03-03 DIAGNOSIS — H34812 Central retinal vein occlusion, left eye, with macular edema: Secondary | ICD-10-CM | POA: Diagnosis not present

## 2022-05-15 DIAGNOSIS — H3582 Retinal ischemia: Secondary | ICD-10-CM | POA: Diagnosis not present

## 2022-05-15 DIAGNOSIS — H43812 Vitreous degeneration, left eye: Secondary | ICD-10-CM | POA: Diagnosis not present

## 2022-05-15 DIAGNOSIS — H34812 Central retinal vein occlusion, left eye, with macular edema: Secondary | ICD-10-CM | POA: Diagnosis not present

## 2022-05-15 DIAGNOSIS — H35371 Puckering of macula, right eye: Secondary | ICD-10-CM | POA: Diagnosis not present

## 2022-05-15 DIAGNOSIS — H353122 Nonexudative age-related macular degeneration, left eye, intermediate dry stage: Secondary | ICD-10-CM | POA: Diagnosis not present

## 2022-06-05 DIAGNOSIS — L570 Actinic keratosis: Secondary | ICD-10-CM | POA: Diagnosis not present

## 2022-06-05 DIAGNOSIS — C44622 Squamous cell carcinoma of skin of right upper limb, including shoulder: Secondary | ICD-10-CM | POA: Diagnosis not present

## 2022-06-05 DIAGNOSIS — X32XXXA Exposure to sunlight, initial encounter: Secondary | ICD-10-CM | POA: Diagnosis not present

## 2022-06-05 DIAGNOSIS — C44519 Basal cell carcinoma of skin of other part of trunk: Secondary | ICD-10-CM | POA: Diagnosis not present

## 2022-06-05 DIAGNOSIS — Z85820 Personal history of malignant melanoma of skin: Secondary | ICD-10-CM | POA: Diagnosis not present

## 2022-06-05 DIAGNOSIS — D485 Neoplasm of uncertain behavior of skin: Secondary | ICD-10-CM | POA: Diagnosis not present

## 2022-06-05 DIAGNOSIS — D2271 Melanocytic nevi of right lower limb, including hip: Secondary | ICD-10-CM | POA: Diagnosis not present

## 2022-06-05 DIAGNOSIS — D2261 Melanocytic nevi of right upper limb, including shoulder: Secondary | ICD-10-CM | POA: Diagnosis not present

## 2022-06-05 DIAGNOSIS — Z85828 Personal history of other malignant neoplasm of skin: Secondary | ICD-10-CM | POA: Diagnosis not present

## 2022-06-05 DIAGNOSIS — D2272 Melanocytic nevi of left lower limb, including hip: Secondary | ICD-10-CM | POA: Diagnosis not present

## 2022-06-13 DIAGNOSIS — E559 Vitamin D deficiency, unspecified: Secondary | ICD-10-CM | POA: Diagnosis not present

## 2022-06-13 DIAGNOSIS — E7849 Other hyperlipidemia: Secondary | ICD-10-CM | POA: Diagnosis not present

## 2022-06-13 DIAGNOSIS — C439 Malignant melanoma of skin, unspecified: Secondary | ICD-10-CM | POA: Diagnosis not present

## 2022-06-13 DIAGNOSIS — D519 Vitamin B12 deficiency anemia, unspecified: Secondary | ICD-10-CM | POA: Diagnosis not present

## 2022-06-13 DIAGNOSIS — I5030 Unspecified diastolic (congestive) heart failure: Secondary | ICD-10-CM | POA: Diagnosis not present

## 2022-06-18 DIAGNOSIS — R030 Elevated blood-pressure reading, without diagnosis of hypertension: Secondary | ICD-10-CM | POA: Diagnosis not present

## 2022-06-18 DIAGNOSIS — Z23 Encounter for immunization: Secondary | ICD-10-CM | POA: Diagnosis not present

## 2022-06-18 DIAGNOSIS — E7849 Other hyperlipidemia: Secondary | ICD-10-CM | POA: Diagnosis not present

## 2022-06-18 DIAGNOSIS — Z0001 Encounter for general adult medical examination with abnormal findings: Secondary | ICD-10-CM | POA: Diagnosis not present

## 2022-06-18 DIAGNOSIS — E559 Vitamin D deficiency, unspecified: Secondary | ICD-10-CM | POA: Diagnosis not present

## 2022-06-18 DIAGNOSIS — Z6825 Body mass index (BMI) 25.0-25.9, adult: Secondary | ICD-10-CM | POA: Diagnosis not present

## 2022-06-18 DIAGNOSIS — I5030 Unspecified diastolic (congestive) heart failure: Secondary | ICD-10-CM | POA: Diagnosis not present

## 2022-06-18 DIAGNOSIS — C4359 Malignant melanoma of other part of trunk: Secondary | ICD-10-CM | POA: Diagnosis not present

## 2022-07-08 DIAGNOSIS — C44622 Squamous cell carcinoma of skin of right upper limb, including shoulder: Secondary | ICD-10-CM | POA: Diagnosis not present

## 2022-07-08 DIAGNOSIS — D0461 Carcinoma in situ of skin of right upper limb, including shoulder: Secondary | ICD-10-CM | POA: Diagnosis not present

## 2022-07-21 ENCOUNTER — Encounter: Payer: Self-pay | Admitting: *Deleted

## 2022-07-21 ENCOUNTER — Ambulatory Visit: Payer: Self-pay | Admitting: *Deleted

## 2022-07-21 NOTE — Patient Instructions (Signed)
Visit Information  Thank you for taking time to visit with me today. Please don't hesitate to contact me if I can be of assistance to you.   Please call the care guide team at [phone] if you need to cancel or reschedule your appointment.   If you are experiencing a Mental Health or Behavioral Health Crisis or need someone to talk to, please call the Suicide and Crisis Lifeline: 988 call the [place] National Suicide Prevention Lifeline: 1-800-273-8255 or TTY: 1-800-799-4 TTY (1-800-799-4889) to talk to a trained counselor call 1-800-273-TALK (toll free, 24 hour hotline) go to [facility] [address], [place] ([phone]) call the [place] Crisis Line: 800-939-9988 call 911  Patient verbalizes understanding of instructions and care plan provided today and agrees to view in MyChart. Active MyChart status and patient understanding of how to access instructions and care plan via MyChart confirmed with patient.     No further follow up required.  Zackery Brine, BSW, MSW, LCSW  Licensed Clinical Social Worker  [facility]  Mailing Address-[address], [place], [place] [address] Physical Address-[address], [place], [place] [address] Toll Free Main # [phone] Fax # [phone] Cell # [phone] [email or web address]            

## 2022-07-21 NOTE — Patient Outreach (Signed)
  Care Coordination   Initial Visit Note   07/21/2022  Name: Lawrence Bradley MRN: [id number] DOB: [birth year]  Lawrence Bradley is a 81 y.o. year old male who sees Burdine, Virgina Evener, MD for primary care. I spoke with patient's son, Lawrence Bradley by phone today.  What matters to the patients health and wellness today?  No Interventions Identified.   SDOH assessments and interventions completed:  Yes.  SDOH Interventions Today    Flowsheet Row Most Recent Value  SDOH Interventions   Food Insecurity Interventions Intervention Not Indicated, Other (Comment)  [Verified by Son - [name], [name]  Transportation Interventions Patient Resources (Friends/Family), Intervention Not Indicated, Other (Comment)  [Verified by Son - [name]]  Utilities Interventions Intervention Not Indicated, Other (Comment)  [Verified by Son - [name]]  Alcohol Usage Interventions Intervention Not Indicated (Score <7), Other (Comment)  [Verified by Son - [name]]  Financial Strain Interventions Intervention Not Indicated, Other (Comment)  [Verified by Son - [name]]  Physical Activity Interventions Intervention Not Indicated, Patient Refused, Other (Comments)  [Verified by Son - [name]]  Stress Interventions Intervention Not Indicated, Other (Comment)  [Verified by Son - [name] Connections Interventions Intervention Not Indicated, Other (Comment)  [Verified by Son - [name] Coordination Interventions:  Yes, Provided.   Follow up plan: No Further Intervention Required.   Encounter Outcome:  Pt. Visit Completed.   Nat Christen, BSW, MSW, LCSW  Licensed Education officer, [facility]  Mailing [place] [address] [place], [place] [address] Physical Address-[address]  [place], [place] [address] Toll Free Main # [phone] Fax # [phone] Cell # [phone] [email or web address]

## 2022-07-22 DIAGNOSIS — C44519 Basal cell carcinoma of skin of other part of trunk: Secondary | ICD-10-CM | POA: Diagnosis not present

## 2022-07-22 DIAGNOSIS — L111 Transient acantholytic dermatosis [Grover]: Secondary | ICD-10-CM | POA: Diagnosis not present

## 2022-07-24 DIAGNOSIS — H34812 Central retinal vein occlusion, left eye, with macular edema: Secondary | ICD-10-CM | POA: Diagnosis not present

## 2022-07-24 DIAGNOSIS — H35371 Puckering of macula, right eye: Secondary | ICD-10-CM | POA: Diagnosis not present

## 2022-07-24 DIAGNOSIS — H43813 Vitreous degeneration, bilateral: Secondary | ICD-10-CM | POA: Diagnosis not present

## 2022-08-14 DIAGNOSIS — J019 Acute sinusitis, unspecified: Secondary | ICD-10-CM | POA: Diagnosis not present

## 2022-08-14 DIAGNOSIS — J101 Influenza due to other identified influenza virus with other respiratory manifestations: Secondary | ICD-10-CM | POA: Diagnosis not present

## 2022-08-14 DIAGNOSIS — Z03818 Encounter for observation for suspected exposure to other biological agents ruled out: Secondary | ICD-10-CM | POA: Diagnosis not present

## 2022-09-08 DIAGNOSIS — H35032 Hypertensive retinopathy, left eye: Secondary | ICD-10-CM | POA: Diagnosis not present

## 2022-09-08 DIAGNOSIS — H34812 Central retinal vein occlusion, left eye, with macular edema: Secondary | ICD-10-CM | POA: Diagnosis not present

## 2022-09-08 DIAGNOSIS — H43812 Vitreous degeneration, left eye: Secondary | ICD-10-CM | POA: Diagnosis not present

## 2022-09-22 DIAGNOSIS — Z961 Presence of intraocular lens: Secondary | ICD-10-CM | POA: Diagnosis not present

## 2022-09-22 DIAGNOSIS — H34812 Central retinal vein occlusion, left eye, with macular edema: Secondary | ICD-10-CM | POA: Diagnosis not present

## 2022-09-22 DIAGNOSIS — H35371 Puckering of macula, right eye: Secondary | ICD-10-CM | POA: Diagnosis not present

## 2022-09-22 DIAGNOSIS — H43813 Vitreous degeneration, bilateral: Secondary | ICD-10-CM | POA: Diagnosis not present

## 2022-09-25 ENCOUNTER — Other Ambulatory Visit: Payer: Self-pay

## 2022-09-25 ENCOUNTER — Emergency Department: Payer: Medicare HMO

## 2022-09-25 ENCOUNTER — Emergency Department
Admission: EM | Admit: 2022-09-25 | Discharge: 2022-09-25 | Disposition: A | Discharge status: Home / Self Care | Payer: Medicare HMO | Attending: Emergency Medicine | Admitting: Emergency Medicine

## 2022-09-25 DIAGNOSIS — I672 Cerebral atherosclerosis: Secondary | ICD-10-CM | POA: Diagnosis not present

## 2022-09-25 DIAGNOSIS — H538 Other visual disturbances: Secondary | ICD-10-CM | POA: Diagnosis not present

## 2022-09-25 DIAGNOSIS — J3489 Other specified disorders of nose and nasal sinuses: Secondary | ICD-10-CM | POA: Diagnosis not present

## 2022-09-25 DIAGNOSIS — I10 Essential (primary) hypertension: Secondary | ICD-10-CM

## 2022-09-25 DIAGNOSIS — I6523 Occlusion and stenosis of bilateral carotid arteries: Secondary | ICD-10-CM | POA: Diagnosis not present

## 2022-09-25 DIAGNOSIS — I6503 Occlusion and stenosis of bilateral vertebral arteries: Secondary | ICD-10-CM | POA: Diagnosis not present

## 2022-09-25 DIAGNOSIS — M542 Cervicalgia: Secondary | ICD-10-CM | POA: Insufficient documentation

## 2022-09-25 DIAGNOSIS — I491 Atrial premature depolarization: Secondary | ICD-10-CM | POA: Diagnosis not present

## 2022-09-25 LAB — CBC WITH DIFFERENTIAL/PLATELET
Abs Immature Granulocytes: 0.02 10*3/uL (ref 0.00–0.07)
Basophils Absolute: 0 10*3/uL (ref 0.0–0.1)
Basophils Relative: 1 %
Eosinophils Absolute: 0.2 10*3/uL (ref 0.0–0.5)
Eosinophils Relative: 3 %
HCT: 41.9 % (ref 39.0–52.0)
Hemoglobin: 13.9 g/dL (ref 13.0–17.0)
Immature Granulocytes: 0 %
Lymphocytes Relative: 32 %
Lymphs Abs: 2.2 10*3/uL (ref 0.7–4.0)
MCH: 32 pg (ref 26.0–34.0)
MCHC: 33.2 g/dL (ref 30.0–36.0)
MCV: 96.3 fL (ref 80.0–100.0)
Monocytes Absolute: 0.6 10*3/uL (ref 0.1–1.0)
Monocytes Relative: 9 %
Neutro Abs: 3.8 10*3/uL (ref 1.7–7.7)
Neutrophils Relative %: 55 %
Platelets: 188 10*3/uL (ref 150–400)
RBC: 4.35 MIL/uL (ref 4.22–5.81)
RDW: 13.7 % (ref 11.5–15.5)
WBC: 6.9 10*3/uL (ref 4.0–10.5)
nRBC: 0 % (ref 0.0–0.2)

## 2022-09-25 LAB — COMPREHENSIVE METABOLIC PANEL
ALT: 13 U/L (ref 0–44)
AST: 20 U/L (ref 15–41)
Albumin: 3.8 g/dL (ref 3.5–5.0)
Alkaline Phosphatase: 65 U/L (ref 38–126)
Anion gap: 13 (ref 5–15)
BUN: 13 mg/dL (ref 8–23)
CO2: 21 mmol/L — ABNORMAL LOW (ref 22–32)
Calcium: 8.5 mg/dL — ABNORMAL LOW (ref 8.9–10.3)
Chloride: 106 mmol/L (ref 98–111)
Creatinine, Ser: 0.68 mg/dL (ref 0.61–1.24)
GFR, Estimated: >60 mL/min (ref >60)
Glucose, Bld: 99 mg/dL (ref 70–99)
Potassium: 3.3 mmol/L — ABNORMAL LOW (ref 3.5–5.1)
Sodium: 140 mmol/L (ref 135–145)
Total Bilirubin: 0.8 mg/dL (ref 0.3–1.2)
Total Protein: 6.1 g/dL — ABNORMAL LOW (ref 6.5–8.1)

## 2022-09-25 LAB — CBG MONITORING, ED: Glucose-Capillary: 98 mg/dL (ref 70–99)

## 2022-09-25 MED ORDER — AMLODIPINE BESYLATE 5 MG PO TABS: 5.0000 mg | ORAL_TABLET | Freq: Every day | ORAL | 2 refills | Status: AC

## 2022-09-25 MED ORDER — AMLODIPINE BESYLATE 5 MG PO TABS
5.0000 mg | ORAL_TABLET | Freq: Once | ORAL | Status: AC
  Administered 2022-09-25: 5 mg via ORAL
  Filled 2022-09-25: qty 1

## 2022-09-25 MED ORDER — IOHEXOL 350 MG/ML SOLN
75.0000 mL | Freq: Once | INTRAVENOUS | Status: AC | PRN
  Administered 2022-09-25: 75 mL via INTRAVENOUS

## 2022-09-25 MED ORDER — HYDRALAZINE HCL 20 MG/ML IJ SOLN: 10.0000 mg | Freq: Once | INTRAMUSCULAR | Status: DC

## 2022-09-25 NOTE — ED Triage Notes (Signed)
Pt BIBA from [facility]. Pt has involuntary jerking, blurred vision, head pressure.  Pt had fall 2 weeks ago when he hit his head

## 2022-09-25 NOTE — ED Provider Notes (Signed)
[facility] Provider Note    Event Date/Time   First MD Initiated Contact with Patient 09/25/22 1047     (approximate)   History   Chief Complaint: Hypertension   HPI  Lawrence Bradley is a 82 y.o. male with a past history of vertigo and arthritis who comes to the ED due to hypertension, headache, and blurred vision.  Reportedly patient had a fall 2 weeks ago and hit his head but did not seek care at that time.  He reports a rapid onset of generalized headache which is 10/10 in intensity today.  Started this morning.  Associated with blurry vision.  All of the symptoms are new for him.  Denies fever or bodyaches.  Also endorses left neck pain.  No motor weakness or paresthesias.     Physical Exam   Triage Vital Signs: ED Triage Vitals  Enc Vitals Group     BP 09/25/22 1051 (!) 151/90     Pulse Rate 09/25/22 1049 70     Resp 09/25/22 1049 20     Temp 09/25/22 1046 (!) 97.5 F (36.4 C)     Temp Source 09/25/22 1046 Temporal     SpO2 09/25/22 1049 97 %     Weight 09/25/22 1050 187 lb 4.8 oz (85 kg)     Height 09/25/22 1050 '5\' 11"'$  (1.803 m)     Head Circumference --      Peak Flow --      Pain Score 09/25/22 1050 0     Pain Loc --      Pain Edu? --      Excl. in [place]? --     Most recent vital signs: Vitals:   09/25/22 1300 09/25/22 1330  BP: (!) 171/72 (!) 190/81  Pulse: 69 64  Resp: 14 18  Temp:    SpO2: 96% 96%    General: Awake, no distress.  CV:  Good peripheral perfusion.  Regular rate and rhythm Resp:  Normal effort.  Clear to auscultation bilaterally Abd:  No distention.  Soft nontender Other:  Cranial nerves III through XII intact.  No drift.  Normal cerebellar function.  Normal orientation.   ED Results / Procedures / Treatments   Labs (all labs ordered are listed, but only abnormal results are displayed) Labs Reviewed  COMPREHENSIVE METABOLIC PANEL - Abnormal; Notable for the following components:      Result Value    Potassium 3.3 (*)    CO2 21 (*)    Calcium 8.5 (*)    Total Protein 6.1 (*)    All other components within normal limits  CBC WITH DIFFERENTIAL/PLATELET  CBG MONITORING, ED     EKG Interpreted by me Sinus rhythm rate of 68.  Left axis, normal intervals.  Normal QRS ST segments and T waves.  No ischemic changes.   RADIOLOGY CT angiogram of the head and neck interpreted by me, negative for intracranial mass or hemorrhage.  Radiology report reviewed, no acute findings.   PROCEDURES:  Procedures   MEDICATIONS ORDERED IN ED: Medications  iohexol (OMNIPAQUE) 350 MG/ML injection 75 mL (75 mLs Intravenous Contrast Given 09/25/22 1232)  amLODipine (NORVASC) tablet 5 mg (5 mg Oral Given 09/25/22 1309)     IMPRESSION / MDM / ASSESSMENT AND PLAN / ED COURSE  I reviewed the triage vital signs and the nursing notes.  DDx: Symptomatic hypertension, vertebral dissection, intracranial hemorrhage, intracranial mass.  Doubt ischemic stroke, meningitis encephalitis glaucoma intracranial hypertension  Patient's presentation  is most consistent with acute presentation with potential threat to life or bodily function.  Patient presents with rapid onset severe headache with severe hypertension.  Will check labs, CT angiogram of the head and neck.  ----------------------------------------- 1:53 PM on 09/25/2022 ----------------------------------------- CT unremarkable.  Labs unremarkable.  Patient feeling better with mild improvement of his blood pressure after receiving Norvasc.  Will continue on Norvasc, follow-up with PCP for blood pressure recheck.       FINAL CLINICAL IMPRESSION(S) / ED DIAGNOSES   Final diagnoses:  Uncontrolled hypertension     Rx / DC Orders   ED Discharge Orders          Ordered    amLODipine (NORVASC) 5 MG tablet  Daily        09/25/22 1344             Note:  This document was prepared using Dragon voice recognition software and may include  unintentional dictation errors.   Carrie Mew, MD 09/25/22 [phone]

## 2022-09-25 NOTE — ED Notes (Signed)
Pt transported to CT at this time.

## 2022-09-25 NOTE — ED Notes (Signed)
This tech gave EKG to MD, Jacelyn Grip.

## 2022-10-03 ENCOUNTER — Other Ambulatory Visit
Admission: RE | Admit: 2022-10-03 | Discharge: 2022-10-03 | Disposition: A | Discharge status: Home / Self Care | Payer: Medicare HMO | Source: Ambulatory Visit | Attending: Family Medicine | Admitting: Family Medicine

## 2022-10-03 DIAGNOSIS — R079 Chest pain, unspecified: Secondary | ICD-10-CM | POA: Diagnosis not present

## 2022-10-03 DIAGNOSIS — I10 Essential (primary) hypertension: Secondary | ICD-10-CM | POA: Insufficient documentation

## 2022-10-03 DIAGNOSIS — R002 Palpitations: Secondary | ICD-10-CM | POA: Diagnosis not present

## 2022-10-03 DIAGNOSIS — R0789 Other chest pain: Secondary | ICD-10-CM | POA: Diagnosis not present

## 2022-10-03 DIAGNOSIS — I509 Heart failure, unspecified: Secondary | ICD-10-CM | POA: Diagnosis not present

## 2022-10-03 DIAGNOSIS — R000 Tachycardia, unspecified: Secondary | ICD-10-CM | POA: Diagnosis not present

## 2022-10-03 LAB — TROPONIN I (HIGH SENSITIVITY): Troponin I (High Sensitivity): 9 ng/L (ref <18)

## 2022-10-14 DIAGNOSIS — R002 Palpitations: Secondary | ICD-10-CM | POA: Diagnosis not present

## 2022-10-21 DIAGNOSIS — Z01818 Encounter for other preprocedural examination: Secondary | ICD-10-CM | POA: Diagnosis not present

## 2022-10-21 DIAGNOSIS — I441 Atrioventricular block, second degree: Secondary | ICD-10-CM | POA: Diagnosis not present

## 2022-10-21 DIAGNOSIS — I10 Essential (primary) hypertension: Secondary | ICD-10-CM | POA: Diagnosis not present

## 2022-10-21 DIAGNOSIS — R001 Bradycardia, unspecified: Secondary | ICD-10-CM | POA: Diagnosis not present

## 2022-10-28 DIAGNOSIS — R002 Palpitations: Secondary | ICD-10-CM | POA: Diagnosis not present

## 2022-10-28 DIAGNOSIS — R0789 Other chest pain: Secondary | ICD-10-CM | POA: Diagnosis not present

## 2022-10-30 DIAGNOSIS — I10 Essential (primary) hypertension: Secondary | ICD-10-CM | POA: Diagnosis not present

## 2022-10-30 DIAGNOSIS — Z0000 Encounter for general adult medical examination without abnormal findings: Secondary | ICD-10-CM | POA: Diagnosis not present

## 2022-10-30 DIAGNOSIS — R001 Bradycardia, unspecified: Secondary | ICD-10-CM | POA: Diagnosis not present

## 2022-11-06 ENCOUNTER — Encounter
Admission: RE | Disposition: A | Discharge status: Home / Self Care | Payer: Self-pay | Source: Ambulatory Visit | Attending: Cardiology

## 2022-11-06 ENCOUNTER — Other Ambulatory Visit: Payer: Self-pay

## 2022-11-06 ENCOUNTER — Observation Stay: Payer: Medicare HMO

## 2022-11-06 ENCOUNTER — Encounter: Payer: Self-pay | Admitting: Cardiology

## 2022-11-06 ENCOUNTER — Observation Stay
Admission: RE | Admit: 2022-11-06 | Discharge: 2022-11-06 | Disposition: A | Discharge status: Home / Self Care | Payer: Medicare HMO | Source: Ambulatory Visit | Attending: Cardiology | Admitting: Cardiology

## 2022-11-06 DIAGNOSIS — R001 Bradycardia, unspecified: Secondary | ICD-10-CM

## 2022-11-06 DIAGNOSIS — I495 Sick sinus syndrome: Principal | ICD-10-CM | POA: Diagnosis present

## 2022-11-06 DIAGNOSIS — I441 Atrioventricular block, second degree: Secondary | ICD-10-CM | POA: Diagnosis not present

## 2022-11-06 DIAGNOSIS — R55 Syncope and collapse: Secondary | ICD-10-CM

## 2022-11-06 DIAGNOSIS — Z950 Presence of cardiac pacemaker: Secondary | ICD-10-CM | POA: Diagnosis not present

## 2022-11-06 HISTORY — PX: PACEMAKER LEADLESS INSERTION: EP1219

## 2022-11-06 HISTORY — DX: Syncope and collapse: R55

## 2022-11-06 SURGERY — PACEMAKER LEADLESS INSERTION
Anesthesia: Moderate Sedation

## 2022-11-06 MED ORDER — SODIUM CHLORIDE 0.9 % IV SOLN: INTRAVENOUS | Status: DC

## 2022-11-06 MED ORDER — LIDOCAINE HCL (PF) 1 % IJ SOLN
INTRAMUSCULAR | Status: DC | PRN
  Administered 2022-11-06: 60 mL
  Administered 2022-11-06: 20 mL

## 2022-11-06 MED ORDER — MIDAZOLAM HCL 2 MG/2ML IJ SOLN
INTRAMUSCULAR | Status: AC
  Filled 2022-11-06: qty 2

## 2022-11-06 MED ORDER — CEFAZOLIN SODIUM-DEXTROSE 1-4 GM/50ML-% IV SOLN
1.0000 g | Freq: Four times a day (QID) | INTRAVENOUS | Status: DC
  Administered 2022-11-06: 1 g via INTRAVENOUS
  Filled 2022-11-06 (×3): qty 50

## 2022-11-06 MED ORDER — LIDOCAINE HCL 1 % IJ SOLN
INTRAMUSCULAR | Status: AC
  Filled 2022-11-06: qty 20

## 2022-11-06 MED ORDER — ONDANSETRON HCL 4 MG/2ML IJ SOLN: 4.0000 mg | Freq: Four times a day (QID) | INTRAMUSCULAR | Status: DC | PRN

## 2022-11-06 MED ORDER — CEFAZOLIN SODIUM-DEXTROSE 2-4 GM/100ML-% IV SOLN: 2.0000 g | INTRAVENOUS | Status: AC

## 2022-11-06 MED ORDER — MIDAZOLAM HCL 2 MG/2ML IJ SOLN
INTRAMUSCULAR | Status: DC | PRN
  Administered 2022-11-06: 1 mg via INTRAVENOUS

## 2022-11-06 MED ORDER — CEFAZOLIN SODIUM-DEXTROSE 2-4 GM/100ML-% IV SOLN
INTRAVENOUS | Status: AC
  Administered 2022-11-06: 2 g via INTRAVENOUS
  Filled 2022-11-06: qty 100

## 2022-11-06 MED ORDER — SODIUM CHLORIDE 0.9 % IV SOLN
80.0000 mg | INTRAVENOUS | Status: AC
  Administered 2022-11-06: 80 mg
  Filled 2022-11-06: qty 80

## 2022-11-06 MED ORDER — ACETAMINOPHEN 325 MG PO TABS: 325.0000 mg | ORAL_TABLET | ORAL | Status: DC | PRN

## 2022-11-06 MED ORDER — HEPARIN (PORCINE) IN NACL 1000-0.9 UT/500ML-% IV SOLN
INTRAVENOUS | Status: AC
  Filled 2022-11-06: qty 1000

## 2022-11-06 MED ORDER — FENTANYL CITRATE (PF) 100 MCG/2ML IJ SOLN
INTRAMUSCULAR | Status: DC | PRN
  Administered 2022-11-06 (×2): 25 ug via INTRAVENOUS

## 2022-11-06 MED ORDER — HEPARIN (PORCINE) IN NACL 1000-0.9 UT/500ML-% IV SOLN
INTRAVENOUS | Status: DC | PRN
  Administered 2022-11-06: 500 mL

## 2022-11-06 MED ORDER — FENTANYL CITRATE (PF) 100 MCG/2ML IJ SOLN
INTRAMUSCULAR | Status: AC
  Filled 2022-11-06: qty 2

## 2022-11-06 MED ORDER — FLUTICASONE PROPIONATE 50 MCG/ACT NA SUSP: 1.0000 | Freq: Every day | NASAL | Status: DC

## 2022-11-06 MED ORDER — CEPHALEXIN 500 MG PO CAPS: 500.0000 mg | ORAL_CAPSULE | Freq: Two times a day (BID) | ORAL | 0 refills | Status: DC

## 2022-11-06 MED ORDER — AMLODIPINE BESYLATE 5 MG PO TABS
5.0000 mg | ORAL_TABLET | Freq: Every day | ORAL | Status: DC
  Administered 2022-11-06: 5 mg via ORAL

## 2022-11-06 SURGICAL SUPPLY — 21 items
CABLE SURG 12 DISP A/V CHANNEL (MISCELLANEOUS) IMPLANT
DEVICE DSSCT PLSMBLD 3.0S LGHT (MISCELLANEOUS) IMPLANT
DRAPE INCISE 23X17 STRL (DRAPES) IMPLANT
DRAPE INCISE IOBAN 23X17 STRL (DRAPES) ×1 IMPLANT
INTRO PACEMAKR LEAD 9FR 13CM (INTRODUCER) ×1
INTRO PACEMKR SHEATH II 7FR (MISCELLANEOUS) ×1
INTRODUCER PACEMKR LD 9FR 13CM (INTRODUCER) IMPLANT
INTRODUCER PACEMKR SHTH II 7FR (MISCELLANEOUS) IMPLANT
IPG PACE AZUR XT DR MRI W1DR01 (Pacemaker) IMPLANT
LEAD CAPSURE NOVUS 5076-52CM (Lead) IMPLANT
LEAD CAPSURE NOVUS 5076-58CM (Lead) IMPLANT
PACE AZURE XT DR MRI W1DR01 (Pacemaker) ×1 IMPLANT
PACK CARDIAC CATH (CUSTOM PROCEDURE TRAY) ×1 IMPLANT
PAD ELECT DEFIB RADIOL ZOLL (MISCELLANEOUS) IMPLANT
PLASMABLADE 3.0S W/LIGHT (MISCELLANEOUS) ×1
SLING ARM IMMOBILIZER LRG (SOFTGOODS) IMPLANT
SUT SILK 0 FSL (SUTURE) IMPLANT
SUT VIC AB 2-0 CT2 27 (SUTURE) IMPLANT
SUT VICRYL 4-0  27 PS-2 BARIAT (SUTURE) ×1
SUT VICRYL 4-0 27 PS-2 BARIAT (SUTURE) ×1
SUTURE VICRYL 4-0 27 PS-2 BART (SUTURE) IMPLANT

## 2022-11-06 NOTE — Discharge Summary (Signed)
   Physician Discharge Summary  Patient ID: Lawrence Bradley MRN: [id number] DOB/AGE: [birth year] 82 y.o.  Admit date: 11/06/2022 Discharge date: 11/06/2022  Primary Discharge Diagnosis sick sinus syndrome Secondary Discharge Diagnosis remittent second-degree AV block  Significant Diagnostic Studies: yes  Consults: None  Hospital Course: Patient underwent elective dual-chamber pacemaker implantation on 11/06/2022 for sick sinus syndrome and intermittent second-degree AV block.  There were no periprocedural complications.  The patient was observed on telemetry he was noted to be in atrial and ventricular pacing at 62 bpm.  CT revealed AV pacing at 67 bpm.  Chest x-ray revealed appropriate ventricular and atrial leads without evidence for pneumothorax.  The patient was discharged home in stable condition.  He is scheduled to see Dr. Clayborn Bigness follow-up within 10 days.   Discharge Exam: Blood pressure 131/71, pulse 65, temperature 97.9 F (36.6 C), temperature source Oral, resp. rate 17, height 5' 10.5" (1.791 m), weight 76.2 kg, SpO2 95 %.   General appearance: alert Head: Normocephalic, without obvious abnormality, atraumatic Neck: no adenopathy, no carotid bruit, no JVD, supple, symmetrical, trachea midline, and thyroid not enlarged, symmetric, no tenderness/mass/nodules Back: symmetric, no curvature. ROM normal. No CVA tenderness. Chest wall: no tenderness Cardio: regular rate and rhythm, S1, S2 normal, no murmur, click, rub or gallop Extremities: extremities normal, atraumatic, no cyanosis or edema Lymph nodes: Cervical, supraclavicular, and axillary nodes normal. Neurologic: Grossly normal Labs:   Lab Results  Component Value Date   WBC 6.9 09/25/2022   HGB 13.9 09/25/2022   HCT 41.9 09/25/2022   MCV 96.3 09/25/2022   PLT 188 09/25/2022   No results for input(s): "NA", "K", "CL", "CO2", "BUN", "CREATININE", "CALCIUM", "PROT", "BILITOT", "ALKPHOS", "ALT", "AST", "GLUCOSE" in the  last 168 hours.  Invalid input(s): "LABALBU"    Radiology: No pneumothorax EKG: Atrial and ventricular pacing at 69 bpm  FOLLOW UP PLANS AND APPOINTMENTS  Allergies as of 11/06/2022   No Known Allergies      Medication List     STOP taking these medications    Fish Oil + D3 1000-1000 MG-UNIT Caps   meclizine 25 MG tablet Commonly known as: ANTIVERT       TAKE these medications    Aleve 220 MG tablet Generic drug: naproxen sodium Take 220 mg by mouth daily as needed (mild pain).   amLODipine 5 MG tablet Commonly known as: NORVASC Take 1 tablet (5 mg total) by mouth daily.   cephALEXin 500 MG capsule Commonly known as: Keflex Take 1 capsule (500 mg total) by mouth 2 (two) times daily.   cyclobenzaprine 10 MG tablet Commonly known as: FLEXERIL Take 10 mg by mouth 3 (three) times daily as needed for muscle spasms.   fluticasone 50 MCG/ACT nasal spray Commonly known as: FLONASE Place 1 spray into both nostrils in the morning and at bedtime.         BRING ALL MEDICATIONS WITH YOU TO FOLLOW UP APPOINTMENTS  Time spent with patient to include physician time: 25 minutes Signed:  Isaias Cowman MD, PhD, [facility] 11/06/2022, 4:55 PM

## 2022-11-06 NOTE — Discharge Instructions (Signed)
For 6 weeks, avoid lifting greater than 15 pounds or raising your left arm above your head. Please do not shower until Saturday and do not submerge your left chest in water (no baths, swimming) for at least 1 week or until you follow up with Dr. Clayborn Bigness. You can remove the clear bandage on your left chest if it starts to peel off, but please do NOT take off the steri strips underneath (thin rectangular strips). Take all your medicines as prescribed, including the antibiotic I have prescribed called Keflex (cefalexin) to prevent infection on your skin. Please call Dr. Clayborn Bigness 's office at [facility] [phone]) or if you have any questions or concerns.

## 2022-11-07 ENCOUNTER — Encounter: Payer: Self-pay | Admitting: Cardiology

## 2022-11-25 ENCOUNTER — Other Ambulatory Visit: Payer: Self-pay | Admitting: Internal Medicine

## 2022-11-25 DIAGNOSIS — R079 Chest pain, unspecified: Secondary | ICD-10-CM

## 2022-11-25 DIAGNOSIS — I2089 Other forms of angina pectoris: Secondary | ICD-10-CM | POA: Diagnosis not present

## 2022-11-25 DIAGNOSIS — I441 Atrioventricular block, second degree: Secondary | ICD-10-CM | POA: Diagnosis not present

## 2022-11-25 DIAGNOSIS — R001 Bradycardia, unspecified: Secondary | ICD-10-CM | POA: Diagnosis not present

## 2022-11-25 DIAGNOSIS — Z950 Presence of cardiac pacemaker: Secondary | ICD-10-CM | POA: Diagnosis not present

## 2022-11-25 DIAGNOSIS — R0789 Other chest pain: Secondary | ICD-10-CM | POA: Diagnosis not present

## 2022-11-25 DIAGNOSIS — I10 Essential (primary) hypertension: Secondary | ICD-10-CM | POA: Diagnosis not present

## 2022-12-01 DIAGNOSIS — H35371 Puckering of macula, right eye: Secondary | ICD-10-CM | POA: Diagnosis not present

## 2022-12-01 DIAGNOSIS — Z961 Presence of intraocular lens: Secondary | ICD-10-CM | POA: Diagnosis not present

## 2022-12-01 DIAGNOSIS — H34812 Central retinal vein occlusion, left eye, with macular edema: Secondary | ICD-10-CM | POA: Diagnosis not present

## 2022-12-01 DIAGNOSIS — H43813 Vitreous degeneration, bilateral: Secondary | ICD-10-CM | POA: Diagnosis not present

## 2022-12-16 DIAGNOSIS — D225 Melanocytic nevi of trunk: Secondary | ICD-10-CM | POA: Diagnosis not present

## 2022-12-16 DIAGNOSIS — D0462 Carcinoma in situ of skin of left upper limb, including shoulder: Secondary | ICD-10-CM | POA: Diagnosis not present

## 2022-12-16 DIAGNOSIS — D485 Neoplasm of uncertain behavior of skin: Secondary | ICD-10-CM | POA: Diagnosis not present

## 2022-12-16 DIAGNOSIS — Z08 Encounter for follow-up examination after completed treatment for malignant neoplasm: Secondary | ICD-10-CM | POA: Diagnosis not present

## 2022-12-16 DIAGNOSIS — D2261 Melanocytic nevi of right upper limb, including shoulder: Secondary | ICD-10-CM | POA: Diagnosis not present

## 2022-12-16 DIAGNOSIS — Z85828 Personal history of other malignant neoplasm of skin: Secondary | ICD-10-CM | POA: Diagnosis not present

## 2022-12-16 DIAGNOSIS — Z85820 Personal history of malignant melanoma of skin: Secondary | ICD-10-CM | POA: Diagnosis not present

## 2022-12-16 DIAGNOSIS — D0461 Carcinoma in situ of skin of right upper limb, including shoulder: Secondary | ICD-10-CM | POA: Diagnosis not present

## 2022-12-16 DIAGNOSIS — L570 Actinic keratosis: Secondary | ICD-10-CM | POA: Diagnosis not present

## 2022-12-16 DIAGNOSIS — L821 Other seborrheic keratosis: Secondary | ICD-10-CM | POA: Diagnosis not present

## 2022-12-16 DIAGNOSIS — D2262 Melanocytic nevi of left upper limb, including shoulder: Secondary | ICD-10-CM | POA: Diagnosis not present

## 2022-12-17 ENCOUNTER — Telehealth (HOSPITAL_COMMUNITY): Payer: Self-pay | Admitting: *Deleted

## 2022-12-17 NOTE — Telephone Encounter (Signed)
Reaching out to patient and his son to offer assistance regarding upcoming cardiac imaging study; pt and son verbalizes understanding of appt date/time, parking situation and where to check in, pre-test NPO status, and verified current allergies; name and call back number provided for further questions should they arise  Larey Brick RN Navigator Cardiac Imaging [facility] [phone] office [phone] cell  Patient states HR is usually about 60 bpm. No additional medications sent to pharmacy.

## 2022-12-18 ENCOUNTER — Ambulatory Visit
Admission: RE | Admit: 2022-12-18 | Discharge: 2022-12-18 | Disposition: A | Discharge status: Home / Self Care | Payer: Medicare HMO | Source: Ambulatory Visit | Attending: Internal Medicine | Admitting: Internal Medicine

## 2022-12-18 DIAGNOSIS — R079 Chest pain, unspecified: Secondary | ICD-10-CM | POA: Diagnosis not present

## 2022-12-18 DIAGNOSIS — I2089 Other forms of angina pectoris: Secondary | ICD-10-CM

## 2022-12-18 MED ORDER — NITROGLYCERIN 0.4 MG SL SUBL
0.8000 mg | SUBLINGUAL_TABLET | Freq: Once | SUBLINGUAL | Status: AC
  Administered 2022-12-18: 0.8 mg via SUBLINGUAL

## 2022-12-18 MED ORDER — METOPROLOL TARTRATE 5 MG/5ML IV SOLN: 10.0000 mg | Freq: Once | INTRAVENOUS | Status: DC

## 2022-12-18 NOTE — Progress Notes (Signed)
Patient tolerated procedure well. Ambulate w/o difficulty. Denies any lightheadedness or being dizzy. Pt denies any pain at this time. Sitting in chair, pt is encouraged to drink additional water throughout the day and reason explained to patient. Patient verbalized understanding and all questions answered. ABC intact. No further needs at this time. Discharge from procedure area w/o issues.  

## 2022-12-19 MED ORDER — IOHEXOL 350 MG/ML SOLN
100.0000 mL | Freq: Once | INTRAVENOUS | Status: AC | PRN
  Administered 2022-12-18: 100 mL via INTRAVENOUS

## 2022-12-25 DIAGNOSIS — I10 Essential (primary) hypertension: Secondary | ICD-10-CM | POA: Diagnosis not present

## 2022-12-25 DIAGNOSIS — I495 Sick sinus syndrome: Secondary | ICD-10-CM | POA: Diagnosis not present

## 2022-12-25 DIAGNOSIS — E782 Mixed hyperlipidemia: Secondary | ICD-10-CM | POA: Diagnosis not present

## 2022-12-25 DIAGNOSIS — I441 Atrioventricular block, second degree: Secondary | ICD-10-CM | POA: Diagnosis not present

## 2022-12-25 DIAGNOSIS — R001 Bradycardia, unspecified: Secondary | ICD-10-CM | POA: Diagnosis not present

## 2022-12-25 DIAGNOSIS — E559 Vitamin D deficiency, unspecified: Secondary | ICD-10-CM | POA: Diagnosis not present

## 2022-12-25 DIAGNOSIS — E785 Hyperlipidemia, unspecified: Secondary | ICD-10-CM | POA: Diagnosis not present

## 2022-12-25 DIAGNOSIS — R000 Tachycardia, unspecified: Secondary | ICD-10-CM | POA: Diagnosis not present

## 2022-12-25 DIAGNOSIS — Z950 Presence of cardiac pacemaker: Secondary | ICD-10-CM | POA: Diagnosis not present

## 2022-12-25 DIAGNOSIS — Z0000 Encounter for general adult medical examination without abnormal findings: Secondary | ICD-10-CM | POA: Diagnosis not present

## 2022-12-25 DIAGNOSIS — R0789 Other chest pain: Secondary | ICD-10-CM | POA: Diagnosis not present

## 2022-12-25 DIAGNOSIS — R002 Palpitations: Secondary | ICD-10-CM | POA: Diagnosis not present

## 2023-01-01 DIAGNOSIS — D0461 Carcinoma in situ of skin of right upper limb, including shoulder: Secondary | ICD-10-CM | POA: Diagnosis not present

## 2023-01-01 DIAGNOSIS — D0462 Carcinoma in situ of skin of left upper limb, including shoulder: Secondary | ICD-10-CM | POA: Diagnosis not present

## 2023-01-29 DIAGNOSIS — H34812 Central retinal vein occlusion, left eye, with macular edema: Secondary | ICD-10-CM | POA: Diagnosis not present

## 2023-02-24 DIAGNOSIS — I10 Essential (primary) hypertension: Secondary | ICD-10-CM | POA: Diagnosis not present

## 2023-03-10 DIAGNOSIS — I441 Atrioventricular block, second degree: Secondary | ICD-10-CM | POA: Diagnosis not present

## 2023-03-23 DIAGNOSIS — R030 Elevated blood-pressure reading, without diagnosis of hypertension: Secondary | ICD-10-CM | POA: Diagnosis not present

## 2023-03-31 DIAGNOSIS — E559 Vitamin D deficiency, unspecified: Secondary | ICD-10-CM | POA: Diagnosis not present

## 2023-03-31 DIAGNOSIS — I10 Essential (primary) hypertension: Secondary | ICD-10-CM | POA: Diagnosis not present

## 2023-04-02 DIAGNOSIS — H35371 Puckering of macula, right eye: Secondary | ICD-10-CM | POA: Diagnosis not present

## 2023-04-02 DIAGNOSIS — Z961 Presence of intraocular lens: Secondary | ICD-10-CM | POA: Diagnosis not present

## 2023-04-02 DIAGNOSIS — H43813 Vitreous degeneration, bilateral: Secondary | ICD-10-CM | POA: Diagnosis not present

## 2023-04-02 DIAGNOSIS — H34812 Central retinal vein occlusion, left eye, with macular edema: Secondary | ICD-10-CM | POA: Diagnosis not present

## 2023-06-04 DIAGNOSIS — Z961 Presence of intraocular lens: Secondary | ICD-10-CM | POA: Diagnosis not present

## 2023-06-04 DIAGNOSIS — H34812 Central retinal vein occlusion, left eye, with macular edema: Secondary | ICD-10-CM | POA: Diagnosis not present

## 2023-06-04 DIAGNOSIS — H35371 Puckering of macula, right eye: Secondary | ICD-10-CM | POA: Diagnosis not present

## 2023-06-04 DIAGNOSIS — H43813 Vitreous degeneration, bilateral: Secondary | ICD-10-CM | POA: Diagnosis not present

## 2023-06-18 DIAGNOSIS — R42 Dizziness and giddiness: Secondary | ICD-10-CM | POA: Diagnosis not present

## 2023-06-18 DIAGNOSIS — G4700 Insomnia, unspecified: Secondary | ICD-10-CM | POA: Diagnosis not present

## 2023-07-07 DIAGNOSIS — Z950 Presence of cardiac pacemaker: Secondary | ICD-10-CM | POA: Diagnosis not present

## 2023-07-07 DIAGNOSIS — R002 Palpitations: Secondary | ICD-10-CM | POA: Diagnosis not present

## 2023-07-07 DIAGNOSIS — E782 Mixed hyperlipidemia: Secondary | ICD-10-CM | POA: Diagnosis not present

## 2023-07-07 DIAGNOSIS — R079 Chest pain, unspecified: Secondary | ICD-10-CM | POA: Diagnosis not present

## 2023-07-07 DIAGNOSIS — I495 Sick sinus syndrome: Secondary | ICD-10-CM | POA: Diagnosis not present

## 2023-07-07 DIAGNOSIS — I441 Atrioventricular block, second degree: Secondary | ICD-10-CM | POA: Diagnosis not present

## 2023-07-07 DIAGNOSIS — I2089 Other forms of angina pectoris: Secondary | ICD-10-CM | POA: Diagnosis not present

## 2023-07-07 DIAGNOSIS — I10 Essential (primary) hypertension: Secondary | ICD-10-CM | POA: Diagnosis not present

## 2023-07-07 DIAGNOSIS — R001 Bradycardia, unspecified: Secondary | ICD-10-CM | POA: Diagnosis not present

## 2023-07-14 DIAGNOSIS — Z85828 Personal history of other malignant neoplasm of skin: Secondary | ICD-10-CM | POA: Diagnosis not present

## 2023-07-14 DIAGNOSIS — L820 Inflamed seborrheic keratosis: Secondary | ICD-10-CM | POA: Diagnosis not present

## 2023-07-14 DIAGNOSIS — D2272 Melanocytic nevi of left lower limb, including hip: Secondary | ICD-10-CM | POA: Diagnosis not present

## 2023-07-14 DIAGNOSIS — L570 Actinic keratosis: Secondary | ICD-10-CM | POA: Diagnosis not present

## 2023-07-14 DIAGNOSIS — D2261 Melanocytic nevi of right upper limb, including shoulder: Secondary | ICD-10-CM | POA: Diagnosis not present

## 2023-07-14 DIAGNOSIS — D225 Melanocytic nevi of trunk: Secondary | ICD-10-CM | POA: Diagnosis not present

## 2023-07-14 DIAGNOSIS — D2262 Melanocytic nevi of left upper limb, including shoulder: Secondary | ICD-10-CM | POA: Diagnosis not present

## 2023-07-14 DIAGNOSIS — Z85820 Personal history of malignant melanoma of skin: Secondary | ICD-10-CM | POA: Diagnosis not present

## 2023-07-14 DIAGNOSIS — D485 Neoplasm of uncertain behavior of skin: Secondary | ICD-10-CM | POA: Diagnosis not present

## 2023-08-06 DIAGNOSIS — H34812 Central retinal vein occlusion, left eye, with macular edema: Secondary | ICD-10-CM | POA: Diagnosis not present

## 2023-08-06 DIAGNOSIS — Z961 Presence of intraocular lens: Secondary | ICD-10-CM | POA: Diagnosis not present

## 2023-08-06 DIAGNOSIS — H35371 Puckering of macula, right eye: Secondary | ICD-10-CM | POA: Diagnosis not present

## 2023-08-06 DIAGNOSIS — H43813 Vitreous degeneration, bilateral: Secondary | ICD-10-CM | POA: Diagnosis not present

## 2023-08-06 DIAGNOSIS — H35033 Hypertensive retinopathy, bilateral: Secondary | ICD-10-CM | POA: Diagnosis not present

## 2023-09-02 DIAGNOSIS — Z950 Presence of cardiac pacemaker: Secondary | ICD-10-CM | POA: Diagnosis not present

## 2023-09-02 DIAGNOSIS — I441 Atrioventricular block, second degree: Secondary | ICD-10-CM | POA: Diagnosis not present

## 2023-09-03 DIAGNOSIS — R001 Bradycardia, unspecified: Secondary | ICD-10-CM | POA: Diagnosis not present

## 2023-09-03 DIAGNOSIS — R079 Chest pain, unspecified: Secondary | ICD-10-CM | POA: Diagnosis not present

## 2023-09-03 DIAGNOSIS — I10 Essential (primary) hypertension: Secondary | ICD-10-CM | POA: Diagnosis not present

## 2023-09-03 DIAGNOSIS — E782 Mixed hyperlipidemia: Secondary | ICD-10-CM | POA: Diagnosis not present

## 2023-09-03 DIAGNOSIS — Z950 Presence of cardiac pacemaker: Secondary | ICD-10-CM | POA: Diagnosis not present

## 2023-09-03 DIAGNOSIS — I441 Atrioventricular block, second degree: Secondary | ICD-10-CM | POA: Diagnosis not present

## 2023-09-03 DIAGNOSIS — R002 Palpitations: Secondary | ICD-10-CM | POA: Diagnosis not present

## 2023-09-03 DIAGNOSIS — I495 Sick sinus syndrome: Secondary | ICD-10-CM | POA: Diagnosis not present

## 2023-09-03 DIAGNOSIS — I2089 Other forms of angina pectoris: Secondary | ICD-10-CM | POA: Diagnosis not present

## 2023-10-02 DIAGNOSIS — R410 Disorientation, unspecified: Secondary | ICD-10-CM | POA: Diagnosis not present

## 2023-10-02 DIAGNOSIS — E559 Vitamin D deficiency, unspecified: Secondary | ICD-10-CM | POA: Diagnosis not present

## 2023-10-02 DIAGNOSIS — R82998 Other abnormal findings in urine: Secondary | ICD-10-CM | POA: Diagnosis not present

## 2023-10-02 DIAGNOSIS — I10 Essential (primary) hypertension: Secondary | ICD-10-CM | POA: Diagnosis not present

## 2023-10-02 DIAGNOSIS — I495 Sick sinus syndrome: Secondary | ICD-10-CM | POA: Diagnosis not present

## 2023-10-08 DIAGNOSIS — H34812 Central retinal vein occlusion, left eye, with macular edema: Secondary | ICD-10-CM | POA: Diagnosis not present

## 2023-12-10 DIAGNOSIS — H35371 Puckering of macula, right eye: Secondary | ICD-10-CM | POA: Diagnosis not present

## 2023-12-10 DIAGNOSIS — Z961 Presence of intraocular lens: Secondary | ICD-10-CM | POA: Diagnosis not present

## 2023-12-10 DIAGNOSIS — H43813 Vitreous degeneration, bilateral: Secondary | ICD-10-CM | POA: Diagnosis not present

## 2023-12-10 DIAGNOSIS — H34812 Central retinal vein occlusion, left eye, with macular edema: Secondary | ICD-10-CM | POA: Diagnosis not present

## 2023-12-10 DIAGNOSIS — H35033 Hypertensive retinopathy, bilateral: Secondary | ICD-10-CM | POA: Diagnosis not present

## 2024-01-11 DIAGNOSIS — R600 Localized edema: Secondary | ICD-10-CM | POA: Diagnosis not present

## 2024-01-11 DIAGNOSIS — E782 Mixed hyperlipidemia: Secondary | ICD-10-CM | POA: Diagnosis not present

## 2024-01-11 DIAGNOSIS — Z950 Presence of cardiac pacemaker: Secondary | ICD-10-CM | POA: Diagnosis not present

## 2024-01-11 DIAGNOSIS — I495 Sick sinus syndrome: Secondary | ICD-10-CM | POA: Diagnosis not present

## 2024-01-11 DIAGNOSIS — R001 Bradycardia, unspecified: Secondary | ICD-10-CM | POA: Diagnosis not present

## 2024-01-11 DIAGNOSIS — I441 Atrioventricular block, second degree: Secondary | ICD-10-CM | POA: Diagnosis not present

## 2024-01-11 DIAGNOSIS — R002 Palpitations: Secondary | ICD-10-CM | POA: Diagnosis not present

## 2024-01-11 DIAGNOSIS — I10 Essential (primary) hypertension: Secondary | ICD-10-CM | POA: Diagnosis not present

## 2024-02-02 DIAGNOSIS — D0461 Carcinoma in situ of skin of right upper limb, including shoulder: Secondary | ICD-10-CM | POA: Diagnosis not present

## 2024-02-02 DIAGNOSIS — Z85820 Personal history of malignant melanoma of skin: Secondary | ICD-10-CM | POA: Diagnosis not present

## 2024-02-02 DIAGNOSIS — D225 Melanocytic nevi of trunk: Secondary | ICD-10-CM | POA: Diagnosis not present

## 2024-02-02 DIAGNOSIS — L821 Other seborrheic keratosis: Secondary | ICD-10-CM | POA: Diagnosis not present

## 2024-02-02 DIAGNOSIS — Z85828 Personal history of other malignant neoplasm of skin: Secondary | ICD-10-CM | POA: Diagnosis not present

## 2024-02-02 DIAGNOSIS — Z08 Encounter for follow-up examination after completed treatment for malignant neoplasm: Secondary | ICD-10-CM | POA: Diagnosis not present

## 2024-02-02 DIAGNOSIS — D2262 Melanocytic nevi of left upper limb, including shoulder: Secondary | ICD-10-CM | POA: Diagnosis not present

## 2024-02-02 DIAGNOSIS — D2261 Melanocytic nevi of right upper limb, including shoulder: Secondary | ICD-10-CM | POA: Diagnosis not present

## 2024-02-02 DIAGNOSIS — D2271 Melanocytic nevi of right lower limb, including hip: Secondary | ICD-10-CM | POA: Diagnosis not present

## 2024-02-02 DIAGNOSIS — D2272 Melanocytic nevi of left lower limb, including hip: Secondary | ICD-10-CM | POA: Diagnosis not present

## 2024-02-04 DIAGNOSIS — H43813 Vitreous degeneration, bilateral: Secondary | ICD-10-CM | POA: Diagnosis not present

## 2024-02-04 DIAGNOSIS — Z961 Presence of intraocular lens: Secondary | ICD-10-CM | POA: Diagnosis not present

## 2024-02-04 DIAGNOSIS — H34812 Central retinal vein occlusion, left eye, with macular edema: Secondary | ICD-10-CM | POA: Diagnosis not present

## 2024-02-04 DIAGNOSIS — H35371 Puckering of macula, right eye: Secondary | ICD-10-CM | POA: Diagnosis not present

## 2024-02-04 DIAGNOSIS — H35033 Hypertensive retinopathy, bilateral: Secondary | ICD-10-CM | POA: Diagnosis not present

## 2024-02-10 DIAGNOSIS — Z0000 Encounter for general adult medical examination without abnormal findings: Secondary | ICD-10-CM | POA: Diagnosis not present

## 2024-02-10 DIAGNOSIS — R42 Dizziness and giddiness: Secondary | ICD-10-CM | POA: Diagnosis not present

## 2024-03-01 DIAGNOSIS — I495 Sick sinus syndrome: Secondary | ICD-10-CM | POA: Diagnosis not present

## 2024-03-31 DIAGNOSIS — Z961 Presence of intraocular lens: Secondary | ICD-10-CM | POA: Diagnosis not present

## 2024-03-31 DIAGNOSIS — H35371 Puckering of macula, right eye: Secondary | ICD-10-CM | POA: Diagnosis not present

## 2024-03-31 DIAGNOSIS — H43813 Vitreous degeneration, bilateral: Secondary | ICD-10-CM | POA: Diagnosis not present

## 2024-03-31 DIAGNOSIS — H34812 Central retinal vein occlusion, left eye, with macular edema: Secondary | ICD-10-CM | POA: Diagnosis not present

## 2024-03-31 DIAGNOSIS — H35033 Hypertensive retinopathy, bilateral: Secondary | ICD-10-CM | POA: Diagnosis not present

## 2024-04-01 DIAGNOSIS — I495 Sick sinus syndrome: Secondary | ICD-10-CM | POA: Diagnosis not present

## 2024-04-01 DIAGNOSIS — I10 Essential (primary) hypertension: Secondary | ICD-10-CM | POA: Diagnosis not present

## 2024-04-01 DIAGNOSIS — H6123 Impacted cerumen, bilateral: Secondary | ICD-10-CM | POA: Diagnosis not present

## 2024-04-01 DIAGNOSIS — R42 Dizziness and giddiness: Secondary | ICD-10-CM | POA: Diagnosis not present

## 2024-04-07 DIAGNOSIS — R42 Dizziness and giddiness: Secondary | ICD-10-CM | POA: Diagnosis not present

## 2024-04-07 DIAGNOSIS — H6123 Impacted cerumen, bilateral: Secondary | ICD-10-CM | POA: Diagnosis not present

## 2024-04-07 DIAGNOSIS — I10 Essential (primary) hypertension: Secondary | ICD-10-CM | POA: Diagnosis not present

## 2024-04-07 NOTE — Progress Notes (Signed)
 History of Present Illness:   Lawrence Bradley is a 83 y.o. male here for   Verbally consented to the use of AI for note-taking.   Chief Complaint  Patient presents with  . Ear Fullness    Ear lavage     History of Present Illness Lawrence Bradley is an 83 year old male who presents with earwax impaction and associated hearing loss and dizziness.  He experiences difficulty hearing, attributing it to earwax buildup, and describes the sensation as hearing through a blocked ear. He has been using ear drops with limited success. The earwax impaction significantly affects his ability to hear conversations, leading to misunderstandings, such as not hearing phone calls from his girlfriend.  He reports dizziness, more pronounced in the mornings and improving by the afternoon. He previously stopped taking tamsulosin  to see if it would help with the dizziness, but noticed no improvement. Since stopping the medication, he has experienced a decrease in urination frequency and strength.   Past Medical History:   Past Medical History:  Diagnosis Date  . Hypertension     Past Surgical History:   Past Surgical History:  Procedure Laterality Date  . INSERTION TRANSCATHETER PERMANENT SINGLE-CHAMBER LEADLESS PACEMAKER  2024  . melanoma removal     . REPAIR INGUINAL HERNIA      Allergies:  No Known Allergies  Current Medications:   Prior to Admission medications  Medication Sig Taking? Last Dose  acetaminophen  (TYLENOL ) 500 MG tablet Take 1,000 mg by mouth as needed for Pain Yes Taking  calcium carbonate (TUMS E-X) 300 mg (750 mg) chewable tablet Take 300 mg of elemental by mouth as needed for Heartburn Yes Taking  cholecalciferol 1000 unit tablet Take 1 tablet (1,000 Units total) by mouth once daily Yes Taking  losartan (COZAAR) 50 MG tablet Take 1 tablet (50 mg total) by mouth once daily Yes Taking  tamsulosin  (FLOMAX ) 0.4 mg capsule Take 1 capsule (0.4 mg total) by mouth once daily Take  30 minutes after same meal each day. Yes Taking  cyanocobalamin (VITAMIN B12) 1000 MCG tablet Take 1 tablet (1,000 mcg total) by mouth once daily      Family History:   Family History  Problem Relation Name Age of Onset  . No Known Problems Mother    . Stroke Father    . Alcohol abuse Father      Social History:   Social History   Socioeconomic History  . Marital status: Single  . Number of children: 5  Tobacco Use  . Smoking status: Former    Types: Cigarettes    Start date: 2020    Quit date: 1960    Years since quitting: 65.6    Passive exposure: Never  . Smokeless tobacco: Former    Types: [place]    Quit date: 2006  Advertising account planner  . Vaping status: Never Used  Substance and Sexual Activity  . Alcohol use: Yes    Alcohol/week: 15.0 standard drinks of alcohol    Types: 1 Glasses of wine, 14 Cans of beer per week  . Drug use: Never  . Sexual activity: Defer   Social Drivers of Health   Financial Resource Strain: Low Risk  (04/07/2024)   Overall Financial Resource Strain (CARDIA)   . Difficulty of Paying Living Expenses: Not hard at all  Food Insecurity: No Food Insecurity (04/07/2024)   Hunger Vital Sign   . Worried About Programme researcher, broadcasting/film/video in the Last Year: Never true   . Ran  Out of Food in the Last Year: Never true  Transportation Needs: No Transportation Needs (04/07/2024)   PRAPARE - Transportation   . Lack of Transportation (Medical): No   . Lack of Transportation (Non-Medical): No  Physical Activity: Inactive (07/21/2022)   Received [facility]   Exercise Vital Sign   . On average, how many days per week do you engage in moderate to strenuous exercise (like a brisk walk)?: 0 days   . On average, how many minutes do you engage in exercise at this level?: 0 min  Stress: No Stress Concern Present (07/21/2022)   Received from [facility] - Occupational Stress Questionnaire   . Feeling of Stress : Not at all   Social Connections: Moderately Integrated (07/21/2022)   Received from [facility]   Social Connection and Isolation Panel   . In a typical week, how many times do you talk on the phone with family, friends, or neighbors?: More than three times a week   . How often do you get together with friends or relatives?: More than three times a week   . How often do you attend church or religious services?: More than 4 times per year   . Do you belong to any clubs or organizations such as church groups, unions, fraternal or athletic groups, or school groups?: Yes   . How often do you attend meetings of the clubs or organizations you belong to?: 1 to 4 times per year   . Are you married, widowed, divorced, separated, never married, or living with a partner?: Widowed  Housing Stability: Low Risk  (04/07/2024)   Housing Stability Vital Sign   . Unable to Pay for Housing in the Last Year: No   . Number of Times Moved in the Last Year: 0   . Homeless in the Last Year: No    Review of Systems:   A 10 point review of systems is negative, except for the pertinent positives and negatives detailed in the HPI.  Vitals:   Vitals:   04/07/24 0917  BP: 130/80  Pulse: 76  SpO2: 96%  Weight: 80.5 kg (177 lb 6.4 oz)  Height: 180.3 cm (5' 11)     Body mass index is 24.74 kg/m.  Physical Exam:   Physical Exam Vitals and nursing note reviewed.  Constitutional:      Appearance: Normal appearance.  HENT:     Right Ear: There is impacted cerumen.     Left Ear: There is impacted cerumen.  Cardiovascular:     Rate and Rhythm: Normal rate.     Pulses: Normal pulses.  Pulmonary:     Effort: Pulmonary effort is normal.  Neurological:     Mental Status: He is alert.     Assessment and Plan:  No results found for this visit on 04/07/24.    Assessment & Plan Impacted cerumen, bilateral Bilateral impacted cerumen causing significant hearing impairment. Large amounts of cerumen were removed from  both ears, with the right ear more severely impacted. Complete removal achieved with irrigation, resulting in improved hearing. - Advise that some water may continue to drain from the ears throughout the day. - Consider repeating ear irrigation every six months to prevent recurrence.  Dizziness Dizziness worse in the mornings and improving in the afternoons, potentially related to impacted cerumen. Lightheadedness upon standing, but no acute dizziness post-procedure.   Follow up 4 months, sooner if needed   This note has  been created using automated tools and reviewed for accuracy by provider.  Patient received an After Visit Summary    Attestation Statement:   I personally performed the service, non-incident to. (WP)   GLENDA MACARIO HADDOCK, NP

## 2024-04-12 DIAGNOSIS — D0461 Carcinoma in situ of skin of right upper limb, including shoulder: Secondary | ICD-10-CM | POA: Diagnosis not present

## 2024-04-23 ENCOUNTER — Observation Stay
Admission: EM | Admit: 2024-04-23 | Discharge: 2024-04-24 | Disposition: A | Discharge status: Home / Self Care | Attending: Internal Medicine | Admitting: Internal Medicine

## 2024-04-23 ENCOUNTER — Other Ambulatory Visit: Payer: Self-pay

## 2024-04-23 ENCOUNTER — Emergency Department

## 2024-04-23 DIAGNOSIS — Z87891 Personal history of nicotine dependence: Secondary | ICD-10-CM | POA: Diagnosis not present

## 2024-04-23 DIAGNOSIS — I10 Essential (primary) hypertension: Secondary | ICD-10-CM | POA: Diagnosis present

## 2024-04-23 DIAGNOSIS — I2510 Atherosclerotic heart disease of native coronary artery without angina pectoris: Secondary | ICD-10-CM | POA: Insufficient documentation

## 2024-04-23 DIAGNOSIS — R079 Chest pain, unspecified: Secondary | ICD-10-CM | POA: Diagnosis not present

## 2024-04-23 DIAGNOSIS — K5900 Constipation, unspecified: Principal | ICD-10-CM | POA: Diagnosis present

## 2024-04-23 DIAGNOSIS — I495 Sick sinus syndrome: Secondary | ICD-10-CM | POA: Diagnosis not present

## 2024-04-23 DIAGNOSIS — N3289 Other specified disorders of bladder: Secondary | ICD-10-CM | POA: Diagnosis not present

## 2024-04-23 DIAGNOSIS — R072 Precordial pain: Secondary | ICD-10-CM

## 2024-04-23 DIAGNOSIS — R109 Unspecified abdominal pain: Secondary | ICD-10-CM

## 2024-04-23 HISTORY — DX: Atherosclerotic heart disease of native coronary artery without angina pectoris: I25.10

## 2024-04-23 LAB — COMPREHENSIVE METABOLIC PANEL WITH GFR
ALT: 19 U/L (ref 0–44)
AST: 30 U/L (ref 15–41)
Albumin: 4.2 g/dL (ref 3.5–5.0)
Alkaline Phosphatase: 81 U/L (ref 38–126)
Anion gap: 12 (ref 5–15)
BUN: 12 mg/dL (ref 8–23)
CO2: 22 mmol/L (ref 22–32)
Calcium: 9.6 mg/dL (ref 8.9–10.3)
Chloride: 101 mmol/L (ref 98–111)
Creatinine, Ser: 0.68 mg/dL (ref 0.61–1.24)
GFR, Estimated: >60 mL/min (ref >60)
Glucose, Bld: 120 mg/dL — ABNORMAL HIGH (ref 70–99)
Potassium: 3.5 mmol/L (ref 3.5–5.1)
Sodium: 135 mmol/L (ref 135–145)
Total Bilirubin: 1.3 mg/dL — ABNORMAL HIGH (ref 0.0–1.2)
Total Protein: 7.1 g/dL (ref 6.5–8.1)

## 2024-04-23 LAB — LIPASE, BLOOD: Lipase: 31 U/L (ref 11–51)

## 2024-04-23 LAB — URINALYSIS, ROUTINE W REFLEX MICROSCOPIC
Bilirubin Urine: NEGATIVE
Glucose, UA: NEGATIVE mg/dL
Hgb urine dipstick: NEGATIVE
Ketones, ur: NEGATIVE mg/dL
Leukocytes,Ua: NEGATIVE
Nitrite: NEGATIVE
Protein, ur: NEGATIVE mg/dL
Specific Gravity, Urine: >1.046 — ABNORMAL HIGH (ref 1.005–1.030)
pH: 7 (ref 5.0–8.0)

## 2024-04-23 LAB — CBC
HCT: 44.3 % (ref 39.0–52.0)
Hemoglobin: 14.9 g/dL (ref 13.0–17.0)
MCH: 33 pg (ref 26.0–34.0)
MCHC: 33.6 g/dL (ref 30.0–36.0)
MCV: 98.2 fL (ref 80.0–100.0)
Platelets: 175 K/uL (ref 150–400)
RBC: 4.51 MIL/uL (ref 4.22–5.81)
RDW: 12.9 % (ref 11.5–15.5)
WBC: 10.6 K/uL — ABNORMAL HIGH (ref 4.0–10.5)
nRBC: 0 % (ref 0.0–0.2)

## 2024-04-23 LAB — TROPONIN I (HIGH SENSITIVITY)
Troponin I (High Sensitivity): 24 ng/L — ABNORMAL HIGH (ref <18)
Troponin I (High Sensitivity): 34 ng/L — ABNORMAL HIGH (ref <18)

## 2024-04-23 LAB — TSH: TSH: 1.583 u[IU]/mL (ref 0.350–4.500)

## 2024-04-23 MED ORDER — LACTULOSE 10 GM/15ML PO SOLN
10.0000 g | Freq: Two times a day (BID) | ORAL | Status: DC
  Administered 2024-04-23 – 2024-04-24 (×2): 10 g via ORAL
  Filled 2024-04-23 (×2): qty 30

## 2024-04-23 MED ORDER — PANTOPRAZOLE SODIUM 40 MG IV SOLR
40.0000 mg | Freq: Two times a day (BID) | INTRAVENOUS | Status: DC
  Administered 2024-04-23 – 2024-04-24 (×2): 40 mg via INTRAVENOUS
  Filled 2024-04-23 (×2): qty 10

## 2024-04-23 MED ORDER — ONDANSETRON HCL 4 MG/2ML IJ SOLN: 4.0000 mg | Freq: Four times a day (QID) | INTRAMUSCULAR | Status: DC | PRN

## 2024-04-23 MED ORDER — ONDANSETRON HCL 4 MG PO TABS: 4.0000 mg | ORAL_TABLET | Freq: Four times a day (QID) | ORAL | Status: DC | PRN

## 2024-04-23 MED ORDER — HEPARIN SODIUM (PORCINE) 5000 UNIT/ML IJ SOLN: 5000.0000 [IU] | Freq: Three times a day (TID) | INTRAMUSCULAR | Status: DC

## 2024-04-23 MED ORDER — HYDRALAZINE HCL 20 MG/ML IJ SOLN: 10.0000 mg | Freq: Four times a day (QID) | INTRAMUSCULAR | Status: DC | PRN

## 2024-04-23 MED ORDER — TAMSULOSIN HCL 0.4 MG PO CAPS: 0.4000 mg | ORAL_CAPSULE | Freq: Every day | ORAL | Status: DC

## 2024-04-23 MED ORDER — ALUM & MAG HYDROXIDE-SIMETH 200-200-20 MG/5ML PO SUSP: 15.0000 mL | Freq: Four times a day (QID) | ORAL | Status: DC | PRN

## 2024-04-23 MED ORDER — IOHEXOL 300 MG/ML  SOLN
100.0000 mL | Freq: Once | INTRAMUSCULAR | Status: AC | PRN
  Administered 2024-04-23: 100 mL via INTRAVENOUS

## 2024-04-23 MED ORDER — POLYETHYLENE GLYCOL 3350 17 G PO PACK: 17.0000 g | PACK | Freq: Every day | ORAL | Status: DC | PRN

## 2024-04-23 MED ORDER — ENOXAPARIN SODIUM 40 MG/0.4ML IJ SOSY
40.0000 mg | PREFILLED_SYRINGE | INTRAMUSCULAR | Status: DC
  Administered 2024-04-23: 40 mg via SUBCUTANEOUS
  Filled 2024-04-23: qty 0.4

## 2024-04-23 MED ORDER — ACETAMINOPHEN 325 MG RE SUPP: 650.0000 mg | Freq: Four times a day (QID) | RECTAL | Status: DC | PRN

## 2024-04-23 MED ORDER — TRAZODONE HCL 50 MG PO TABS
25.0000 mg | ORAL_TABLET | Freq: Once | ORAL | Status: AC
  Administered 2024-04-23: 25 mg via ORAL
  Filled 2024-04-23: qty 1

## 2024-04-23 MED ORDER — TAMSULOSIN HCL 0.4 MG PO CAPS
0.4000 mg | ORAL_CAPSULE | Freq: Every day | ORAL | Status: DC
  Administered 2024-04-23: 0.4 mg via ORAL
  Filled 2024-04-23: qty 1

## 2024-04-23 MED ORDER — ACETAMINOPHEN 325 MG PO TABS: 650.0000 mg | ORAL_TABLET | Freq: Four times a day (QID) | ORAL | Status: DC | PRN

## 2024-04-23 MED ORDER — AMLODIPINE BESYLATE 5 MG PO TABS
5.0000 mg | ORAL_TABLET | Freq: Every day | ORAL | Status: DC
  Administered 2024-04-23 – 2024-04-24 (×2): 5 mg via ORAL
  Filled 2024-04-23 (×2): qty 1

## 2024-04-23 NOTE — Discharge Instructions (Signed)
 You were seen in the emergency department today for constipation.  We recommend that you use one or more of the following over-the-counter medications in the order described:   1)  Colace (or Dulcolax) 100 mg:  This is a stool softener, and you may take it once or twice a day as needed. 2)  Senna tablets:  This is a bowel stimulant that will help "push" out your stool. It is the next step to add after you have tried a stool softener. 3)  Miralax (powder):  This medication works by drawing additional fluid into your intestines and helps to flush out your stool.  Mix the powder with water or juice according to label instructions.  It may help if the Colace and Senna are not sufficient, but you must be sure to use the recommended amount of water or juice when you mix up the powder. Remember that narcotic pain medications are constipating, so avoid them or minimize their use.  Drink plenty of fluids.  Please return to the Emergency Department immediately if you develop new or worsening symptoms that concern you, such as (but not limited to) fever > 101 degrees, severe abdominal pain, or persistent vomiting.

## 2024-04-23 NOTE — ED Provider Notes (Signed)
-----------------------------------------   5:54 PM on 04/23/2024 -----------------------------------------  EKG showed diffuse T wave inversions that were not seen on his prior EKGs.  We obtained a troponin from the initial lab draw which was elevated.  Repeat was trending upwards.  Given this finding the patient will need further workup for an ACS rule out.  He is not having any chest pain at this time.  I counseled the patient on the results of the workup and plan of care.  I then consulted Dr. Roann from the hospitalist service; based on our discussion he agrees to evaluate the patient for admission.  ED ECG REPORT I, Waylon Cassis, the attending physician, personally viewed and interpreted this ECG.  Date: 04/23/2024 EKG Time: 1511 Rate: 77 LA Rhythm: normal sinus rhythm QRS Axis: normal Intervals: LAFB ST/T Wave abnormalities: Nonspecific T wave abnormalities in diffuse leads Narrative Interpretation: T wave abnormalities; no specific evidence of acute ischemia    Cassis Waylon, MD 04/23/24 1756

## 2024-04-23 NOTE — ED Notes (Signed)
 This tech assisted pt to toilet and pt was able to void. Pt complaining of nausea again at this time. Pt requested something to eat or drink, but I informed him of his NPO order. Pt demonstrated understanding of this. No further requests at this time.

## 2024-04-23 NOTE — H&P (Signed)
 History and Physical    Lawrence Bradley FMW:[id number] DOB: 1940/11/26 DOA: 04/23/2024  DOS: the patient was seen and examined on 04/23/2024  PCP: [facility]   Patient coming from: Home  I have personally briefly reviewed patient's old medical records in [facility] Link  Chief Complaint: Nausea, constipation and epigastric discomfort  HPI: Lawrence Bradley is a pleasant 83 y.o. male with medical history significant for complete heart block s/p single-chamber pacemaker, HTN, constipation, BPH who came into ED at [facility] complaining of constipation and unable to void.  He also complained of some nausea.  Patient drank magnesium citrate and prune juice 2 days ago, had enema this morning with no relief.  Patient denied any fever, chest pain, shortness of breath.  ED Course: Upon arrival to the ED, patient is found to be hypertensive at 155/79, CT abdomen pelvis showed no obstruction, EKG showed nonspecific diffuse T wave inversion.  Hospitalist service was consulted for evaluation for admission for possible ACS rule out.  Review of Systems:  ROS  All other systems negative except as noted in the HPI.  Past Medical History:  Diagnosis Date   Arthritis    Coronary artery disease    pt unsure. pt had syncopal episode then pacemaker implanted 3/24.   Syncope 11/06/2022   Vertigo     Past Surgical History:  Procedure Laterality Date   CATARACT EXTRACTION W/PHACO Left 05/29/2014   Procedure: CATARACT EXTRACTION PHACO AND INTRAOCULAR LENS PLACEMENT LEFT EYE CDE=9.33;  Surgeon: Dow JULIANNA Burke, MD;  Location: [facility];  Service: Ophthalmology;  Laterality: Left;   CATARACT EXTRACTION W/PHACO Right 08/14/2014   Procedure: CATARACT EXTRACTION PHACO AND INTRAOCULAR LENS PLACEMENT; CDE: 9.98;  Surgeon: Dow JULIANNA Burke, MD;  Location: [facility];  Service: Ophthalmology;  Laterality: Right;   HERNIA REPAIR     x4   PACEMAKER LEADLESS INSERTION N/A 11/06/2022   Procedure: PACEMAKER  LEADLESS INSERTION;  Surgeon: Ammon Blunt, MD;  Location: [facility] INVASIVE CV LAB;  Service: Cardiovascular;  Laterality: N/A;   right foot surgery     TONSILLECTOMY       reports that he has quit smoking. He has been exposed to tobacco smoke. He has quit using smokeless tobacco. He reports that he does not currently use alcohol. He reports that he does not use drugs.  No Known Allergies  History reviewed. No pertinent family history.  Prior to Admission medications   Medication Sig Start Date End Date Taking? Authorizing Provider  amLODipine  (NORVASC ) 5 MG tablet Take 1 tablet (5 mg total) by mouth daily. 09/25/22 12/24/22  Viviann Pastor, MD  cephALEXin  (KEFLEX ) 500 MG capsule Take 1 capsule (500 mg total) by mouth 2 (two) times daily. Patient not taking: Reported on 12/18/2022 11/06/22   Hulen Arleene Browning, PA-C  cyclobenzaprine (FLEXERIL) 10 MG tablet Take 10 mg by mouth 3 (three) times daily as needed for muscle spasms.    [provider]  fluticasone  (FLONASE ) 50 MCG/ACT nasal spray Place 1 spray into both nostrils in the morning and at bedtime. Patient not taking: Reported on 12/18/2022 08/14/22   [provider]  naproxen sodium (ALEVE) 220 MG tablet Take 220 mg by mouth daily as needed (mild pain).    [provider]    Physical Exam: Vitals:   04/23/24 1112 04/23/24 1512 04/23/24 1530 04/23/24 1600  BP: (!) 155/79 (!) 161/84 (!) 183/78 (!) 170/82  Pulse:  77 77 73  Resp:  (!) 22 18 (!) 23  Temp:  97.9 F (36.6 C)    TempSrc:  Oral    SpO2:  98% 97% 98%  Weight: 77.1 kg     Height: 5' 11 (1.803 m)       Physical Exam   Constitutional: Alert, awake, calm, comfortable HEENT: Neck supple Respiratory: Clear to auscultation B/L, no wheezing, no rales.  Cardiovascular: Regular rate and rhythm, no murmurs / rubs / gallops. No extremity edema. 2+ pedal pulses. No carotid bruits.  Abdomen: Soft, no tenderness, Bowel sounds positive.   Musculoskeletal: no clubbing / cyanosis. Good ROM, no contractures. Normal muscle tone.  Skin: no rashes, lesions, ulcers. Neurologic: CN 2-12 grossly intact. Sensation intact, No focal deficit identified Psychiatric: Alert and oriented x 3. Normal mood.    Labs on Admission: I have personally reviewed following labs and imaging studies  CBC: Recent Labs  Lab 04/23/24 1114  WBC 10.6*  HGB 14.9  HCT 44.3  MCV 98.2  PLT 175   Basic Metabolic Panel: Recent Labs  Lab 04/23/24 1114  NA 135  K 3.5  CL 101  CO2 22  GLUCOSE 120*  BUN 12  CREATININE 0.68  CALCIUM 9.6   GFR: Estimated Creatinine Clearance: 77.1 mL/min (by C-G formula based on SCr of 0.68 mg/dL). Liver Function Tests: Recent Labs  Lab 04/23/24 1114  AST 30  ALT 19  ALKPHOS 81  BILITOT 1.3*  PROT 7.1  ALBUMIN 4.2   Recent Labs  Lab 04/23/24 1114  LIPASE 31   No results for input(s): AMMONIA in the last 168 hours. Coagulation Profile: No results for input(s): INR, PROTIME in the last 168 hours. Cardiac Enzymes: Recent Labs  Lab 04/23/24 1114 04/23/24 1612  TROPONINIHS 24* 34*   BNP (last 3 results) No results for input(s): BNP in the last 8760 hours. HbA1C: No results for input(s): HGBA1C in the last 72 hours. CBG: No results for input(s): GLUCAP in the last 168 hours. Lipid Profile: No results for input(s): CHOL, HDL, LDLCALC, TRIG, CHOLHDL, LDLDIRECT in the last 72 hours. Thyroid  Function Tests: No results for input(s): TSH, T4TOTAL, FREET4, T3FREE, THYROIDAB in the last 72 hours. Anemia Panel: No results for input(s): VITAMINB12, FOLATE, FERRITIN, TIBC, IRON, RETICCTPCT in the last 72 hours. Urine analysis:    Component Value Date/Time   COLORURINE STRAW (A) 04/23/2024 1513   APPEARANCEUR CLEAR (A) 04/23/2024 1513   LABSPEC >1.046 (H) 04/23/2024 1513   PHURINE 7.0 04/23/2024 1513   GLUCOSEU NEGATIVE 04/23/2024 1513   HGBUR NEGATIVE  04/23/2024 1513   BILIRUBINUR NEGATIVE 04/23/2024 1513   KETONESUR NEGATIVE 04/23/2024 1513   PROTEINUR NEGATIVE 04/23/2024 1513   NITRITE NEGATIVE 04/23/2024 1513   LEUKOCYTESUR NEGATIVE 04/23/2024 1513    Radiological Exams on Admission: I have personally reviewed images CT ABDOMEN PELVIS W CONTRAST Result Date: 04/23/2024 EXAM: CT ABDOMEN AND PELVIS WITH CONTRAST 04/23/2024 01:44:07 PM TECHNIQUE: CT of the abdomen and pelvis was performed with the administration of intravenous contrast. Multiplanar reformatted images are provided for review. Automated exposure control, iterative reconstruction, and/or weight-based adjustment of the mA/kV was utilized to reduce the radiation dose to as low as reasonably achievable. COMPARISON: None available. CLINICAL HISTORY: Bowel obstruction suspected. Nausea w/ constipation. FINDINGS: LOWER CHEST: Visualized lung bases are clear. LIVER: Hemangioma in the right lobe of the liver on image 18 of series 2 measures 8 mm. A 17 mm flash filling hemangioma in the left lobe of the liver is isodense on delayed images. GALLBLADDER AND BILE DUCTS: No wall thickening.  No cholelithiasis. No biliary ductal dilatation. SPLEEN: Normal size. No focal lesion. PANCREAS: No mass. No ductal dilatation. ADRENAL GLANDS: Normal appearance. No mass. KIDNEYS, URETERS AND BLADDER: A 3.9 cm simple cyst is present at the upper pole of the right kidney. Per consensus, no follow-up is needed for simple Bosniak type 1 and 2 renal cysts, unless the patient has a malignancy history or risk factors. No stones in the kidneys or ureters. No hydronephrosis. No perinephric or periureteral stranding. Urinary bladder is unremarkable. GI AND BOWEL: Stomach demonstrates no acute abnormality. There is no bowel obstruction. No bowel wall thickening. PERITONEUM AND RETROPERITONEUM: No ascites. No free air. VASCULATURE: Extensive atherosclerotic calcifications are present in the aorta and branch vessels without  aneurysm. LYMPH NODES: No lymphadenopathy. REPRODUCTIVE ORGANS: The prostate gland is mildly enlarged, encroaching on the bladder base. It measures 4.7 cm in transverse diameter. BONES AND SOFT TISSUES: Diffused anterior osteophytes are present in the lower thoracic spine. Degenerative changes are present in the lower lumbar spine. The SI joints are fused bilaterally. No acute osseous abnormality. Fat herniates into the inguinal canals bilaterally without associated bowel. IMPRESSION: 1. No evidence of bowel obstruction or other acute abnormality. 2. Other, non-acute and/or normal findings as above. 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  1.  Abnormal EKG in the setting of nausea and epigastric discomfort - He does not complain chest pain - There were diffuse T inversion in the EKG - Troponins are slightly elevated at 24 and 34 - He will be placed in observation - He will be placed in chest pain protocol, trend troponin, telemetry, echocardiogram, lipid panel - If anything above is abnormal then we will call cardiology. - Will give him Protonix  and Maalox  2.  HTN - Fairly controlled - Will continue his home medications - Will continue to monitor blood pressure  3.  Sick sinus syndrome - S/p pacemaker  4.  Constipation - Will give him bowel regimen    DVT prophylaxis: Lovenox  Code Status: Full Code Family Communication: None available Disposition  Plan: Home Consults called: None Admission status: Observation, Telemetry bed   Nena Rebel, MD [facility] 04/23/2024, 5:49 PM

## 2024-04-23 NOTE — ED Triage Notes (Signed)
 Pt to ED POV for nausea and constipation for 2 days.  Pt drank mag citrate and prune juice 2 days ago. Pt did enema this AM with no relief. Pt denies abdominal pain.

## 2024-04-23 NOTE — ED Provider Notes (Signed)
 [facility] Provider Note    Event Date/Time   First MD Initiated Contact with Patient 04/23/24 1143     (approximate)   History   Nausea and Constipation   HPI  Lawrence Bradley is a 83 y.o. male has been having a few days of feeling constipated, particularly the last 2.  He has tried using an enema at home which she reports gave him a brief amount of watery stool but he felt like the enema he gave just wash back.  Additionally he tried magnesium citrate by mouth and did not find relief but it did cause him to feel nauseated.  There is been no vomiting.  He has a sense of fullness and constipation.  No fevers no chest pain.  No bloody stool.  Still eating and drinking but feels very full as though he needs to have a bowel movement but cannot.  Normal urination.  No trouble urinating, feels like he is emptying his bladder fine   Reports the abdomen is not painful at all.  He just reports it feels full and constipated or backed up.  Denies history of abdominal surgeries.  He reports he is not had any bowel blockages in the past, but has intermittently had constipation  No particular sense of rectal pressure  Physical Exam   Triage Vital Signs: ED Triage Vitals  Encounter Vitals Group     BP 04/23/24 1112 (!) 155/79     Girls Systolic BP Percentile --      Girls Diastolic BP Percentile --      Boys Systolic BP Percentile --      Boys Diastolic BP Percentile --      Pulse Rate 04/23/24 1110 81     Resp 04/23/24 1110 20     Temp 04/23/24 1110 98.2 F (36.8 C)     Temp Source 04/23/24 1110 Oral     SpO2 04/23/24 1110 95 %     Weight 04/23/24 1112 170 lb (77.1 kg)     Height 04/23/24 1112 5' 11 (1.803 m)     Head Circumference --      Peak Flow --      Pain Score 04/23/24 1110 0     Pain Loc --      Pain Education --      Exclude from Growth Chart --     Most recent vital signs: Vitals:   04/23/24 1110 04/23/24 1112  BP:  (!) 155/79   Pulse: 81   Resp: 20   Temp: 98.2 F (36.8 C)   SpO2: 95%    Labs reassuring including normal CBC with exception of very minimally elevated white count.  LFTs normal  General: Awake, no distress.  Very pleasant.  Stands without difficulty. CV:  Good peripheral perfusion.  Normal tones and rate Resp:  Normal effort.  Normal work of breathing Abd:  No distention.  The abdomen is soft nontender nondistended throughout.  I am not able to reproduce tenderness.  The abdomen does not appear particularly distended.  Question if he may have a sense of slight fullness in the lower lower quadrant but no distinctly palpable mass.  Rectal exam performed by me, normal brown stool small amount.  There is no rectal blockage or impaction that I am able to palpate. Other:  Warm well-perfused extremity   ED Results / Procedures / Treatments   Labs (all labs ordered are listed, but only abnormal results are displayed) Labs Reviewed  COMPREHENSIVE METABOLIC PANEL WITH GFR - Abnormal; Notable for the following components:      Result Value   Glucose, Bld 120 (*)    Total Bilirubin 1.3 (*)    All other components within normal limits  CBC - Abnormal; Notable for the following components:   WBC 10.6 (*)    All other components within normal limits  LIPASE, BLOOD  URINALYSIS, ROUTINE W REFLEX MICROSCOPIC    RADIOLOGY  CTA of the abdomen pelvis interpreted me as grossly negative for acute pathology  CT ABDOMEN PELVIS W CONTRAST Result Date: 04/23/2024 EXAM: CT ABDOMEN AND PELVIS WITH CONTRAST 04/23/2024 01:44:07 PM TECHNIQUE: CT of the abdomen and pelvis was performed with the administration of intravenous contrast. Multiplanar reformatted images are provided for review. Automated exposure control, iterative reconstruction, and/or weight-based adjustment of the mA/kV was utilized to reduce the radiation dose to as low as reasonably achievable. COMPARISON: None available. CLINICAL HISTORY: Bowel  obstruction suspected. Nausea w/ constipation. FINDINGS: LOWER CHEST: Visualized lung bases are clear. LIVER: Hemangioma in the right lobe of the liver on image 18 of series 2 measures 8 mm. A 17 mm flash filling hemangioma in the left lobe of the liver is isodense on delayed images. GALLBLADDER AND BILE DUCTS: No wall thickening. No cholelithiasis. No biliary ductal dilatation. SPLEEN: Normal size. No focal lesion. PANCREAS: No mass. No ductal dilatation. ADRENAL GLANDS: Normal appearance. No mass. KIDNEYS, URETERS AND BLADDER: A 3.9 cm simple cyst is present at the upper pole of the right kidney. Per consensus, no follow-up is needed for simple Bosniak type 1 and 2 renal cysts, unless the patient has a malignancy history or risk factors. No stones in the kidneys or ureters. No hydronephrosis. No perinephric or periureteral stranding. Urinary bladder is unremarkable. GI AND BOWEL: Stomach demonstrates no acute abnormality. There is no bowel obstruction. No bowel wall thickening. PERITONEUM AND RETROPERITONEUM: No ascites. No free air. VASCULATURE: Extensive atherosclerotic calcifications are present in the aorta and branch vessels without aneurysm. LYMPH NODES: No lymphadenopathy. REPRODUCTIVE ORGANS: The prostate gland is mildly enlarged, encroaching on the bladder base. It measures 4.7 cm in transverse diameter. BONES AND SOFT TISSUES: Diffused anterior osteophytes are present in the lower thoracic spine. Degenerative changes are present in the lower lumbar spine. The SI joints are fused bilaterally. No acute osseous abnormality. Fat herniates into the inguinal canals bilaterally without associated bowel. IMPRESSION: 1. No evidence of bowel obstruction or other acute abnormality. 2. Other, non-acute and/or normal findings as above. Electronically signed by: Lonni Necessary MD 04/23/2024 02:39 PM EDT RP Workstation: [id number]      PROCEDURES:  Critical Care performed: No  Procedures   MEDICATIONS  ORDERED IN ED: Medications  iohexol  (OMNIPAQUE ) 300 MG/ML solution 100 mL (100 mLs Intravenous Contrast Given 04/23/24 1336)     IMPRESSION / MDM / ASSESSMENT AND PLAN / ED COURSE  I reviewed the triage vital signs and the nursing notes.                              Differential diagnosis includes but is not limited to, abdominal perforation, aortic dissection, cholecystitis, appendicitis, diverticulitis, colitis, esophagitis/gastritis, kidney stone, pyelonephritis, urinary tract infection, aortic aneurysm. All are considered in decision and treatment plan. Based upon the patient's presentation and risk factors, I think it would be a best to proceed with CT imaging to evaluate for potential obstructive or other acute intra-abdominal pathology.  There is no  cardiopulmonary symptoms.  No fever.  He does not have any active nausea or vomiting.  Reports passing flatus this morning and having some very small bowel movement yesterday.  He is still eating and drinking but feels full.  I will obtain CT imaging to evaluate for acute pathology including obstruction.  No clear evidence of fecal impaction on examination today.   Patient's presentation is most consistent with acute complicated illness / injury requiring diagnostic workup.   While I do think it is reassuring he does not have abdominal pain to palpation reports it is not painful but feels more full I think it is best to proceed with imaging to evaluate further and make certain there is no obstructive finding mass etc. especially given the patient's age over 45.     Clinical Course as of 04/23/24 1505  Sat Apr 23, 2024  1502 CT imaging reassuring.  No evidence of acute obstructive pathology or other concern noted at this time. [MQ]  1502 Patient reports he is feeling better actually, reports he felt like a gurgling discomfort in his abdomen and then started to feel better.  He has not had any nausea or vomiting.  He is drinking water at  this time. [MQ]  1503 Sample which I think is reasonable to exclude UTI as a potential cause for he is agreeable to provide a urine abdominal discomfort. [MQ]  1503 Also obtain ECG, very low pretest probability that any of symptoms today would be related to ACS but given his age wish to exclude any frank abnormality or ischemic concern as a potential etiology for abdominal pain though seems quite unlikely. [MQ]  1503 Discussed with the patient if his urinalysis does not show any evidence of infection and his EKG looks okay he is very comfortable with the plan to discharge.  He reviewed with nursing use of MiraLAX  as an additive to help with feeling of constipation [MQ]  1504 Cussed care for return precautions with the patient as well including, severe abdominal pain, fevers, vomiting, or any worsening concerns or symptoms he would return to the ER. [MQ]    Clinical Course User Index [MQ] Dicky Anes, MD     FINAL CLINICAL IMPRESSION(S) / ED DIAGNOSES   Final diagnoses:  Constipation, unspecified constipation type  Nonspecific abdominal pain     Rx / DC Orders   ED Discharge Orders     None        Note:  This document was prepared using Dragon voice recognition software and may include unintentional dictation errors.   Dicky Anes, MD 04/23/24 [phone]

## 2024-04-23 NOTE — ED Notes (Signed)
 Pt in bed, pt has no complaints at this time, pt denies pain.

## 2024-04-23 NOTE — ED Provider Notes (Signed)
 Ongoing care and disposition assigned to Dr. Jacolyn. Plan follow-up on UA and EKG. Anticipate discharge to home for probably constipation like symptoms if no other abnormalities or concerns on pending EKG and jerris Dicky Anes, MD 04/23/24 1507

## 2024-04-23 NOTE — ED Notes (Signed)
 This tech went to check on Lawrence Bradley due to monitor ringing, and Lawrence Bradley stated he forgot to mention something to the doctor. When asked what he forgot to mention, Lawrence Bradley stated he has been having hallucinations of people and dogs in his room and that this morning he woke up to see a big man standing in front of my bed. I told the Lawrence Bradley I will inform the doctor. No further requests at this time.

## 2024-04-24 ENCOUNTER — Observation Stay (HOSPITAL_BASED_OUTPATIENT_CLINIC_OR_DEPARTMENT_OTHER)
Admit: 2024-04-24 | Discharge: 2024-04-24 | Disposition: A | Discharge status: Home / Self Care | Attending: Hospitalist | Admitting: Hospitalist

## 2024-04-24 DIAGNOSIS — R0789 Other chest pain: Secondary | ICD-10-CM | POA: Diagnosis not present

## 2024-04-24 DIAGNOSIS — R079 Chest pain, unspecified: Secondary | ICD-10-CM | POA: Diagnosis not present

## 2024-04-24 LAB — ECHOCARDIOGRAM COMPLETE
AV Mean grad: 9.5 mmHg
AV Peak grad: 19 mmHg
Ao pk vel: 2.18 m/s
Area-P 1/2: 3.46 cm2
Height: 71 in
S' Lateral: 3 cm
Weight: 2720 [oz_av]

## 2024-04-24 LAB — COMPREHENSIVE METABOLIC PANEL WITH GFR
ALT: 16 U/L (ref 0–44)
AST: 25 U/L (ref 15–41)
Albumin: 3.7 g/dL (ref 3.5–5.0)
Alkaline Phosphatase: 73 U/L (ref 38–126)
Anion gap: 9 (ref 5–15)
BUN: 11 mg/dL (ref 8–23)
CO2: 26 mmol/L (ref 22–32)
Calcium: 9 mg/dL (ref 8.9–10.3)
Chloride: 105 mmol/L (ref 98–111)
Creatinine, Ser: 0.62 mg/dL (ref 0.61–1.24)
GFR, Estimated: >60 mL/min (ref >60)
Glucose, Bld: 104 mg/dL — ABNORMAL HIGH (ref 70–99)
Potassium: 3.8 mmol/L (ref 3.5–5.1)
Sodium: 140 mmol/L (ref 135–145)
Total Bilirubin: 1.3 mg/dL — ABNORMAL HIGH (ref 0.0–1.2)
Total Protein: 6.4 g/dL — ABNORMAL LOW (ref 6.5–8.1)

## 2024-04-24 LAB — LIPID PANEL
Cholesterol: 199 mg/dL (ref 0–200)
HDL: 106 mg/dL (ref >40)
LDL Cholesterol: 79 mg/dL (ref 0–99)
Total CHOL/HDL Ratio: 1.9 ratio
Triglycerides: 68 mg/dL (ref <150)
VLDL: 14 mg/dL (ref 0–40)

## 2024-04-24 LAB — CBC
HCT: 40.1 % (ref 39.0–52.0)
Hemoglobin: 13.6 g/dL (ref 13.0–17.0)
MCH: 33.1 pg (ref 26.0–34.0)
MCHC: 33.9 g/dL (ref 30.0–36.0)
MCV: 97.6 fL (ref 80.0–100.0)
Platelets: 149 K/uL — ABNORMAL LOW (ref 150–400)
RBC: 4.11 MIL/uL — ABNORMAL LOW (ref 4.22–5.81)
RDW: 12.9 % (ref 11.5–15.5)
WBC: 6.2 K/uL (ref 4.0–10.5)
nRBC: 0 % (ref 0.0–0.2)

## 2024-04-24 LAB — PROTIME-INR
INR: 1 (ref 0.8–1.2)
Prothrombin Time: 14.1 s (ref 11.4–15.2)

## 2024-04-24 NOTE — Progress Notes (Signed)
  Echocardiogram 2D Echocardiogram has been performed.  Thedora GORMAN Louder 04/24/2024, 9:49 AM

## 2024-04-24 NOTE — Discharge Summary (Signed)
 Physician Discharge Summary  Lawrence Bradley FMW:[id number] DOB: 11/10/1940 DOA: 04/23/2024  PCP: [facility]  Admit date: 04/23/2024 Discharge date: 04/24/2024  Admitted From: home Disposition:  home   Recommendations for Outpatient Follow-up:  Follow up with PCP in 1-2 weeks   Home Health: no  Equipment/Devices:   Discharge Condition: stable  CODE STATUS: full  Diet recommendation: Heart Healthy   Brief/Interim Summary: HPI was taken from Dr. Paudel: Lawrence Bradley is a pleasant 83 y.o. male with medical history significant for complete heart block s/p single-chamber pacemaker, HTN, constipation, BPH who came into ED at [facility] complaining of constipation and unable to void.  He also complained of some nausea.  Patient drank magnesium citrate and prune juice 2 days ago, had enema this morning with no relief.  Patient denied any fever, chest pain, shortness of breath.   ED Course: Upon arrival to the ED, patient is found to be hypertensive at 155/79, CT abdomen pelvis showed no obstruction, EKG showed nonspecific diffuse T wave inversion.  Hospitalist service was consulted for evaluation for admission for possible ACS rule out.  Discharge Diagnoses:  Principal Problem:   Chest pain Active Problems:   Sick sinus syndrome due to SA node dysfunction (HCC)   HTN (hypertension)   Constipation  Nausea: w/ epigastric discomfort. Minimal elevated troponins likely secondary to demand ischemia. Echo shows EF 60-65%, no regional wall motion abnormalities, grade I diastolic dysfunction, mild AR, no atrial level shunts detected. Zofran  prn. EKG has diffuse T wave inversion. Continue on tele. Nausea, epigastric discomfort have resolved   HTN: continue on home dose of amlodipine   Sick sinus syndrome: s/p pacemaker. Continue w/ supportive care   Constipation: had a bowel movement yesterday. Continue w/ bowel regimen   Discharge Instructions  Discharge Instructions     Diet -  low sodium heart healthy   Complete by: As directed    Discharge instructions   Complete by: As directed    F/u w/ PCP in 1-2 weeks   Increase activity slowly   Complete by: As directed       Allergies as of 04/24/2024   No Known Allergies      Medication List     STOP taking these medications    cephALEXin  500 MG capsule Commonly known as: Keflex    cyclobenzaprine 10 MG tablet Commonly known as: FLEXERIL   doxepin 10 MG capsule Commonly known as: SINEQUAN   fluticasone  50 MCG/ACT nasal spray Commonly known as: FLONASE        TAKE these medications    Aleve 220 MG tablet Generic drug: naproxen sodium Take 220 mg by mouth daily as needed (mild pain).   amLODipine  5 MG tablet Commonly known as: NORVASC  Take 1 tablet (5 mg total) by mouth daily.   losartan 50 MG tablet Commonly known as: COZAAR Take 50 mg by mouth daily.   tamsulosin  0.4 MG Caps capsule Commonly known as: FLOMAX  Take 0.4 mg by mouth daily after breakfast.        No Known Allergies  Consultations:    Procedures/Studies: ECHOCARDIOGRAM COMPLETE Result Date: 04/24/2024    ECHOCARDIOGRAM REPORT   Patient Name:   Lawrence Bradley Date of Exam: 04/24/2024 Medical Rec #:  [id number]          Height:       71.0 in Accession #:    [id number]         Weight:       170.0 lb Date of Birth:  1941-08-16          BSA:          1.968 m Patient Age:    82 years           BP:           143/81 mmHg Patient Gender: M                  HR:           76 bpm. Exam Location:  [facility] Procedure: 2D Echo, Cardiac Doppler, Color Doppler and Strain Analysis (Both            Spectral and Color Flow Doppler were utilized during procedure). Indications:     Chest Pain R07.9  History:         Patient has no prior history of Echocardiogram examinations.  Sonographer:     Thedora Louder RDCS, FASE Referring Phys:  [id number] [facility] PAUDEL Diagnosing Phys: Denyse Bathe  Sonographer Comments: No subcostal window. Global  longitudinal strain was attempted. IMPRESSIONS  1. Left ventricular ejection fraction, by estimation, is 60 to 65%. The left ventricle has normal function. The left ventricle has no regional wall motion abnormalities. There is severe concentric left ventricular hypertrophy. Left ventricular diastolic  parameters are consistent with Grade I diastolic dysfunction (impaired relaxation).  2. Right ventricular systolic function is normal. The right ventricular size is normal.  3. The mitral valve is normal in structure. Trivial mitral valve regurgitation. No evidence of mitral stenosis.  4. The aortic valve is normal in structure. Aortic valve regurgitation is mild. No aortic stenosis is present.  5. The inferior vena cava is normal in size with greater than 50% respiratory variability, suggesting right atrial pressure of 3 mmHg. FINDINGS  Left Ventricle: Left ventricular ejection fraction, by estimation, is 60 to 65%. The left ventricle has normal function. The left ventricle has no regional wall motion abnormalities. Strain was performed and the global longitudinal strain is indeterminate. The left ventricular internal cavity size was normal in size. There is severe concentric left ventricular hypertrophy. Left ventricular diastolic parameters are consistent with Grade I diastolic dysfunction (impaired relaxation). Right Ventricle: The right ventricular size is normal. No increase in right ventricular wall thickness. Right ventricular systolic function is normal. Left Atrium: Left atrial size was normal in size. Right Atrium: Right atrial size was normal in size. Pericardium: There is no evidence of pericardial effusion. Mitral Valve: The mitral valve is normal in structure. Trivial mitral valve regurgitation. No evidence of mitral valve stenosis. Tricuspid Valve: The tricuspid valve is normal in structure. Tricuspid valve regurgitation is trivial. No evidence of tricuspid stenosis. Aortic Valve: The aortic valve is  normal in structure. Aortic valve regurgitation is mild. No aortic stenosis is present. Aortic valve mean gradient measures 9.5 mmHg. Aortic valve peak gradient measures 19.0 mmHg. Pulmonic Valve: The pulmonic valve was normal in structure. Pulmonic valve regurgitation is not visualized. No evidence of pulmonic stenosis. Aorta: The aortic root is normal in size and structure. Venous: The inferior vena cava is normal in size with greater than 50% respiratory variability, suggesting right atrial pressure of 3 mmHg. IAS/Shunts: No atrial level shunt detected by color flow Doppler. Additional Comments: 3D was performed not requiring image post processing on an independent workstation and was indeterminate.  LEFT VENTRICLE PLAX 2D LVIDd:         4.50 cm   Diastology LVIDs:         3.00 cm  LV e' medial:    6.96 cm/s LV PW:         1.60 cm   LV E/e' medial:  8.7 LV IVS:        1.50 cm   LV e' lateral:   8.05 cm/s LVOT diam:     2.40 cm   LV E/e' lateral: 7.6 LVOT Area:     4.52 cm  RIGHT VENTRICLE RV Basal diam:  3.20 cm RV S prime:     19.00 cm/s TAPSE (M-mode): 2.2 cm LEFT ATRIUM             Index        RIGHT ATRIUM          Index LA diam:        4.20 cm 2.13 cm/m   RA Area:     9.93 cm LA Vol (A2C):   33.5 ml 17.02 ml/m  RA Volume:   18.70 ml 9.50 ml/m LA Vol (A4C):   56.2 ml 28.56 ml/m LA Biplane Vol: 47.9 ml 24.34 ml/m  AORTIC VALVE               PULMONIC VALVE AV Vmax:      218.00 cm/s  PV Vmax:        1.57 m/s AV Vmean:     140.000 cm/s PV Peak grad:   9.9 mmHg AV VTI:       0.382 m      RVOT Peak grad: 6 mmHg AV Peak Grad: 19.0 mmHg AV Mean Grad: 9.5 mmHg  AORTA Ao Root diam: 3.40 cm Ao Asc diam:  2.60 cm MITRAL VALVE               TRICUSPID VALVE MV Area (PHT): 3.46 cm    TR Peak grad:   28.9 mmHg MV Decel Time: 219 msec    TR Vmax:        269.00 cm/s MV E velocity: 60.80 cm/s MV A velocity: 65.50 cm/s  SHUNTS MV E/A ratio:  0.93        Systemic Diam: 2.40 cm Denyse Bathe Electronically signed by  Denyse Bathe Signature Date/Time: 04/24/2024/11:26:47 AM    Final    CT ABDOMEN PELVIS W CONTRAST Result Date: 04/23/2024 EXAM: CT ABDOMEN AND PELVIS WITH CONTRAST 04/23/2024 01:44:07 PM TECHNIQUE: CT of the abdomen and pelvis was performed with the administration of intravenous contrast. Multiplanar reformatted images are provided for review. Automated exposure control, iterative reconstruction, and/or weight-based adjustment of the mA/kV was utilized to reduce the radiation dose to as low as reasonably achievable. COMPARISON: None available. CLINICAL HISTORY: Bowel obstruction suspected. Nausea w/ constipation. FINDINGS: LOWER CHEST: Visualized lung bases are clear. LIVER: Hemangioma in the right lobe of the liver on image 18 of series 2 measures 8 mm. A 17 mm flash filling hemangioma in the left lobe of the liver is isodense on delayed images. GALLBLADDER AND BILE DUCTS: No wall thickening. No cholelithiasis. No biliary ductal dilatation. SPLEEN: Normal size. No focal lesion. PANCREAS: No mass. No ductal dilatation. ADRENAL GLANDS: Normal appearance. No mass. KIDNEYS, URETERS AND BLADDER: A 3.9 cm simple cyst is present at the upper pole of the right kidney. Per consensus, no follow-up is needed for simple Bosniak type 1 and 2 renal cysts, unless the patient has a malignancy history or risk factors. No stones in the kidneys or ureters. No hydronephrosis. No perinephric or periureteral stranding. Urinary bladder is unremarkable. GI AND BOWEL: Stomach demonstrates no acute abnormality. There  is no bowel obstruction. No bowel wall thickening. PERITONEUM AND RETROPERITONEUM: No ascites. No free air. VASCULATURE: Extensive atherosclerotic calcifications are present in the aorta and branch vessels without aneurysm. LYMPH NODES: No lymphadenopathy. REPRODUCTIVE ORGANS: The prostate gland is mildly enlarged, encroaching on the bladder base. It measures 4.7 cm in transverse diameter. BONES AND SOFT TISSUES: Diffused  anterior osteophytes are present in the lower thoracic spine. Degenerative changes are present in the lower lumbar spine. The SI joints are fused bilaterally. No acute osseous abnormality. Fat herniates into the inguinal canals bilaterally without associated bowel. IMPRESSION: 1. No evidence of bowel obstruction or other acute abnormality. 2. Other, non-acute and/or normal findings as above. Electronically signed by: Lonni Necessary MD 04/23/2024 02:39 PM EDT RP Workstation: [id number]   (Echo, Carotid, EGD, Colonoscopy, ERCP)    Subjective: Pt denies any chest pain, shortness of breath, nausea, vomiting or abd pain.    Discharge Exam: Vitals:   04/24/24 0430 04/24/24 0924  BP: (!) 143/81 128/67  Pulse: 65 70  Resp: 20 19  Temp: (!) 97.1 F (36.2 C) 98.2 F (36.8 C)  SpO2: 94% 94%   Vitals:   04/23/24 1957 04/24/24 0007 04/24/24 0430 04/24/24 0924  BP: (!) 150/61 125/63 (!) 143/81 128/67  Pulse: 77 70 65 70  Resp: 16 (!) 24 20 19   Temp: 98.1 F (36.7 C) 98 F (36.7 C) (!) 97.1 F (36.2 C) 98.2 F (36.8 C)  TempSrc:    Oral  SpO2: 96% 92% 94% 94%  Weight:      Height:        General: Pt is alert, awake, not in acute distress Cardiovascular: S1/S2 +, no rubs, no gallops Respiratory: CTA bilaterally, no wheezing, no rhonchi Abdominal: Soft, NT, ND, bowel sounds + Extremities: no edema, no cyanosis    The results of significant diagnostics from this hospitalization (including imaging, microbiology, ancillary and laboratory) are listed below for reference.     Microbiology: No results found for this or any previous visit (from the past 240 hours).   Labs: BNP (last 3 results) No results for input(s): BNP in the last 8760 hours. Basic Metabolic Panel: Recent Labs  Lab 04/23/24 1114 04/24/24 0424  NA 135 140  K 3.5 3.8  CL 101 105  CO2 22 26  GLUCOSE 120* 104*  BUN 12 11  CREATININE 0.68 0.62  CALCIUM 9.6 9.0   Liver Function Tests: Recent Labs  Lab  04/23/24 1114 04/24/24 0424  AST 30 25  ALT 19 16  ALKPHOS 81 73  BILITOT 1.3* 1.3*  PROT 7.1 6.4*  ALBUMIN 4.2 3.7   Recent Labs  Lab 04/23/24 1114  LIPASE 31   No results for input(s): AMMONIA in the last 168 hours. CBC: Recent Labs  Lab 04/23/24 1114 04/24/24 0424  WBC 10.6* 6.2  HGB 14.9 13.6  HCT 44.3 40.1  MCV 98.2 97.6  PLT 175 149*   Cardiac Enzymes: No results for input(s): CKTOTAL, CKMB, CKMBINDEX, TROPONINI in the last 168 hours. BNP: Invalid input(s): POCBNP CBG: No results for input(s): GLUCAP in the last 168 hours. D-Dimer No results for input(s): DDIMER in the last 72 hours. Hgb A1c No results for input(s): HGBA1C in the last 72 hours. Lipid Profile Recent Labs    04/24/24 0424  CHOL 199  HDL 106  LDLCALC 79  TRIG 68  CHOLHDL 1.9   Thyroid  function studies Recent Labs    04/23/24 1612  TSH 1.583   Anemia work up No  results for input(s): VITAMINB12, FOLATE, FERRITIN, TIBC, IRON, RETICCTPCT in the last 72 hours. Urinalysis    Component Value Date/Time   COLORURINE STRAW (A) 04/23/2024 1513   APPEARANCEUR CLEAR (A) 04/23/2024 1513   LABSPEC >1.046 (H) 04/23/2024 1513   PHURINE 7.0 04/23/2024 1513   GLUCOSEU NEGATIVE 04/23/2024 1513   HGBUR NEGATIVE 04/23/2024 1513   BILIRUBINUR NEGATIVE 04/23/2024 1513   KETONESUR NEGATIVE 04/23/2024 1513   PROTEINUR NEGATIVE 04/23/2024 1513   NITRITE NEGATIVE 04/23/2024 1513   LEUKOCYTESUR NEGATIVE 04/23/2024 1513   Sepsis Labs Recent Labs  Lab 04/23/24 1114 04/24/24 0424  WBC 10.6* 6.2   Microbiology No results found for this or any previous visit (from the past 240 hours).   Time coordinating discharge: 33 minutes  SIGNED:   Anthony CHRISTELLA Pouch, MD  [facility] 04/24/2024, 11:53 AM Pager   If 7PM-7AM, please contact night-coverage [email or web address]

## 2024-05-12 DIAGNOSIS — I10 Essential (primary) hypertension: Secondary | ICD-10-CM | POA: Diagnosis not present

## 2024-05-12 DIAGNOSIS — R112 Nausea with vomiting, unspecified: Secondary | ICD-10-CM | POA: Diagnosis not present

## 2024-05-12 DIAGNOSIS — Z09 Encounter for follow-up examination after completed treatment for conditions other than malignant neoplasm: Secondary | ICD-10-CM | POA: Diagnosis not present

## 2024-05-12 DIAGNOSIS — K5900 Constipation, unspecified: Secondary | ICD-10-CM | POA: Diagnosis not present

## 2024-05-26 ENCOUNTER — Ambulatory Visit
Admission: RE | Admit: 2024-05-26 | Discharge: 2024-05-26 | Disposition: A | Discharge status: Home / Self Care | Source: Ambulatory Visit | Attending: Family Medicine | Admitting: Family Medicine

## 2024-05-26 ENCOUNTER — Other Ambulatory Visit: Payer: Self-pay | Admitting: Family Medicine

## 2024-05-26 DIAGNOSIS — R112 Nausea with vomiting, unspecified: Secondary | ICD-10-CM

## 2024-05-26 DIAGNOSIS — R1031 Right lower quadrant pain: Secondary | ICD-10-CM | POA: Insufficient documentation

## 2024-05-26 DIAGNOSIS — I10 Essential (primary) hypertension: Secondary | ICD-10-CM | POA: Diagnosis not present

## 2024-05-26 DIAGNOSIS — R197 Diarrhea, unspecified: Secondary | ICD-10-CM

## 2024-05-26 DIAGNOSIS — K4020 Bilateral inguinal hernia, without obstruction or gangrene, not specified as recurrent: Secondary | ICD-10-CM | POA: Diagnosis not present

## 2024-05-26 DIAGNOSIS — R441 Visual hallucinations: Secondary | ICD-10-CM

## 2024-05-26 DIAGNOSIS — R42 Dizziness and giddiness: Secondary | ICD-10-CM

## 2024-05-26 DIAGNOSIS — R4182 Altered mental status, unspecified: Secondary | ICD-10-CM | POA: Diagnosis not present

## 2024-05-26 DIAGNOSIS — H34812 Central retinal vein occlusion, left eye, with macular edema: Secondary | ICD-10-CM | POA: Diagnosis not present

## 2024-05-26 DIAGNOSIS — R413 Other amnesia: Secondary | ICD-10-CM | POA: Diagnosis not present

## 2024-05-26 DIAGNOSIS — R109 Unspecified abdominal pain: Secondary | ICD-10-CM | POA: Diagnosis not present

## 2024-05-26 DIAGNOSIS — D1803 Hemangioma of intra-abdominal structures: Secondary | ICD-10-CM | POA: Diagnosis not present

## 2024-05-26 DIAGNOSIS — K769 Liver disease, unspecified: Secondary | ICD-10-CM | POA: Diagnosis not present

## 2024-05-26 MED ORDER — IOHEXOL 300 MG/ML  SOLN
100.0000 mL | Freq: Once | INTRAMUSCULAR | Status: AC | PRN
  Administered 2024-05-26: 100 mL via INTRAVENOUS

## 2024-07-18 ENCOUNTER — Ambulatory Visit (HOSPITAL_COMMUNITY)

## 2024-07-27 DIAGNOSIS — R600 Localized edema: Secondary | ICD-10-CM | POA: Diagnosis not present

## 2024-07-27 DIAGNOSIS — R001 Bradycardia, unspecified: Secondary | ICD-10-CM | POA: Diagnosis not present

## 2024-07-27 DIAGNOSIS — R110 Nausea: Secondary | ICD-10-CM | POA: Diagnosis not present

## 2024-07-27 DIAGNOSIS — Z950 Presence of cardiac pacemaker: Secondary | ICD-10-CM | POA: Diagnosis not present

## 2024-07-27 DIAGNOSIS — I10 Essential (primary) hypertension: Secondary | ICD-10-CM | POA: Diagnosis not present

## 2024-07-27 DIAGNOSIS — E782 Mixed hyperlipidemia: Secondary | ICD-10-CM | POA: Diagnosis not present

## 2024-07-27 DIAGNOSIS — I495 Sick sinus syndrome: Secondary | ICD-10-CM | POA: Diagnosis not present

## 2024-07-28 DIAGNOSIS — H35371 Puckering of macula, right eye: Secondary | ICD-10-CM | POA: Diagnosis not present

## 2024-07-28 DIAGNOSIS — H43813 Vitreous degeneration, bilateral: Secondary | ICD-10-CM | POA: Diagnosis not present

## 2024-07-28 DIAGNOSIS — H34812 Central retinal vein occlusion, left eye, with macular edema: Secondary | ICD-10-CM | POA: Diagnosis not present

## 2024-07-28 DIAGNOSIS — Z961 Presence of intraocular lens: Secondary | ICD-10-CM | POA: Diagnosis not present

## 2024-07-28 DIAGNOSIS — H35033 Hypertensive retinopathy, bilateral: Secondary | ICD-10-CM | POA: Diagnosis not present

## 2024-07-28 NOTE — CV Procedure (Signed)
  Device system confirmed to be MRI conditional, with implant date > 6 weeks ago, and no evidence of abandoned or epicardial leads in review of most recent CXR  Device last cleared by EP Provider: Daphne Barrack 07/28/24  Clearance is good through for 1 year as long as parameters remain stable at time of check. If pt undergoes a cardiac device procedure during that time, they should be re-cleared.   Tachy-therapies to be programmed off if applicable with device back to pre-MRI settings after completion of exam.  [employer] - Programming recommendation received through [employer] App/Tablet  Rocky Catalan, RT  07/28/2024 3:50 PM

## 2024-08-02 ENCOUNTER — Ambulatory Visit (HOSPITAL_COMMUNITY)
Admission: RE | Admit: 2024-08-02 | Discharge: 2024-08-02 | Disposition: A | Source: Ambulatory Visit | Attending: Family Medicine | Admitting: Family Medicine

## 2024-08-02 DIAGNOSIS — R112 Nausea with vomiting, unspecified: Secondary | ICD-10-CM

## 2024-08-02 DIAGNOSIS — R42 Dizziness and giddiness: Secondary | ICD-10-CM | POA: Diagnosis not present

## 2024-08-02 DIAGNOSIS — R413 Other amnesia: Secondary | ICD-10-CM | POA: Diagnosis not present

## 2024-08-02 DIAGNOSIS — R441 Visual hallucinations: Secondary | ICD-10-CM

## 2024-08-02 MED ORDER — GADOBUTROL 1 MMOL/ML IV SOLN
7.0000 mL | Freq: Once | INTRAVENOUS | Status: AC | PRN
  Administered 2024-08-02: 7 mL via INTRAVENOUS

## 2024-08-02 NOTE — Progress Notes (Signed)
 Patient was monitored by this RN during MRI scan due to presence of a pacemaker. Cardiac rhythm was continuously monitored throughout the procedure. Prior to the start of the scan, the pacemaker was placed in MRI-safe mode by the MRI technician and/or pacemaker representative. Following the completion of the scan, the device was returned to its pre-MRI settings. Neurological status and orientation post-procedure were unchanged from baseline.   Pre-procedure Heart Rate (Prior to being placed in MRI safe mode): 80 Intra: 100 Post-procedure Heart Rate (Once pacemaker is returned to baseline mode):  57

## 2024-09-05 ENCOUNTER — Other Ambulatory Visit: Payer: Self-pay | Admitting: Gastroenterology

## 2024-09-05 DIAGNOSIS — R112 Nausea with vomiting, unspecified: Secondary | ICD-10-CM

## 2024-09-05 DIAGNOSIS — R1310 Dysphagia, unspecified: Secondary | ICD-10-CM

## 2024-09-08 ENCOUNTER — Ambulatory Visit
Admission: RE | Admit: 2024-09-08 | Discharge: 2024-09-08 | Disposition: A | Discharge status: Home / Self Care | Source: Ambulatory Visit | Attending: Gastroenterology | Admitting: Gastroenterology

## 2024-09-08 DIAGNOSIS — R1310 Dysphagia, unspecified: Secondary | ICD-10-CM | POA: Insufficient documentation

## 2024-09-08 DIAGNOSIS — R112 Nausea with vomiting, unspecified: Secondary | ICD-10-CM | POA: Diagnosis present
